# Patient Record
Sex: Male | Born: 1950 | Race: Black or African American | Hispanic: No | Marital: Single | Smoking: Never smoker
Health system: Southern US, Community
[De-identification: ages and names within clinical notes are randomized; demographics above are authoritative.]

## PROBLEM LIST (undated history)

## (undated) DIAGNOSIS — C61 Malignant neoplasm of prostate: Secondary | ICD-10-CM

## (undated) DIAGNOSIS — C801 Malignant (primary) neoplasm, unspecified: Secondary | ICD-10-CM

## (undated) HISTORY — PX: COLONOSCOPY: SHX174

## (undated) HISTORY — PX: APPENDECTOMY: SHX54

## (undated) HISTORY — PX: PROSTATE BIOPSY: SHX241

## (undated) HISTORY — DX: Malignant (primary) neoplasm, unspecified: C80.1

---

## 2005-08-20 ENCOUNTER — Encounter: Admission: RE | Admit: 2005-08-20 | Discharge: 2005-08-20 | Payer: Self-pay | Admitting: Family Medicine

## 2011-08-24 ENCOUNTER — Ambulatory Visit (INDEPENDENT_AMBULATORY_CARE_PROVIDER_SITE_OTHER): Payer: No Typology Code available for payment source

## 2011-08-24 DIAGNOSIS — J019 Acute sinusitis, unspecified: Secondary | ICD-10-CM

## 2011-08-24 DIAGNOSIS — J209 Acute bronchitis, unspecified: Secondary | ICD-10-CM

## 2011-08-30 ENCOUNTER — Ambulatory Visit (INDEPENDENT_AMBULATORY_CARE_PROVIDER_SITE_OTHER): Payer: No Typology Code available for payment source

## 2011-08-30 DIAGNOSIS — R059 Cough, unspecified: Secondary | ICD-10-CM

## 2011-08-30 DIAGNOSIS — R062 Wheezing: Secondary | ICD-10-CM

## 2011-08-30 DIAGNOSIS — R05 Cough: Secondary | ICD-10-CM

## 2011-09-22 ENCOUNTER — Ambulatory Visit: Payer: No Typology Code available for payment source

## 2011-09-22 ENCOUNTER — Ambulatory Visit (INDEPENDENT_AMBULATORY_CARE_PROVIDER_SITE_OTHER): Payer: No Typology Code available for payment source | Admitting: Family Medicine

## 2011-09-22 VITALS — BP 120/82 | HR 77 | Temp 98.0°F | Resp 16 | Ht 70.5 in | Wt 209.2 lb

## 2011-09-22 DIAGNOSIS — R059 Cough, unspecified: Secondary | ICD-10-CM

## 2011-09-22 DIAGNOSIS — R05 Cough: Secondary | ICD-10-CM

## 2011-09-22 LAB — POCT CBC
Granulocyte percent: 62.8 %G (ref 37–80)
HCT, POC: 43.9 % (ref 43.5–53.7)
Hemoglobin: 14.7 g/dL (ref 14.1–18.1)
Lymph, poc: 1.8 (ref 0.6–3.4)
MCH, POC: 32.3 pg — AB (ref 27–31.2)
MCHC: 33.5 g/dL (ref 31.8–35.4)
MCV: 96.4 fL (ref 80–97)
MID (cbc): 0.5 (ref 0–0.9)
MPV: 8.5 fL (ref 0–99.8)
POC Granulocyte: 3.8 (ref 2–6.9)
POC LYMPH PERCENT: 28.7 %L (ref 10–50)
POC MID %: 8.5 %M (ref 0–12)
Platelet Count, POC: 209 10*3/uL (ref 142–424)
RBC: 4.55 M/uL — AB (ref 4.69–6.13)
RDW, POC: 12.8 %
WBC: 6.1 10*3/uL (ref 4.6–10.2)

## 2011-09-22 MED ORDER — PREDNISONE 20 MG PO TABS: ORAL_TABLET | ORAL | Status: DC

## 2011-09-22 MED ORDER — MOMETASONE FURO-FORMOTEROL FUM 200-5 MCG/ACT IN AERO: 2.0000 | INHALATION_SPRAY | Freq: Two times a day (BID) | RESPIRATORY_TRACT | Status: DC

## 2011-09-22 NOTE — Patient Instructions (Addendum)
Cough, Adult  A cough is a reflex that helps clear your throat and airways. It can help heal the body or may be a reaction to an irritated airway. A cough may only last 2 or 3 weeks (acute) or may last more than 8 weeks (chronic).  CAUSES Acute cough:  Viral or bacterial infections.  Chronic cough:  Infections.   Allergies.   Asthma.   Post-nasal drip.   Smoking.   Heartburn or acid reflux.   Some medicines.   Chronic lung problems (COPD).   Cancer.  SYMPTOMS   Cough.   Fever.   Chest pain.   Increased breathing rate.   High-pitched whistling sound when breathing (wheezing).   Colored mucus that you cough up (sputum).  TREATMENT   A bacterial cough may be treated with antibiotic medicine.   A viral cough must run its course and will not respond to antibiotics.   Your caregiver may recommend other treatments if you have a chronic cough.  HOME CARE INSTRUCTIONS   Only take over-the-counter or prescription medicines for pain, discomfort, or fever as directed by your caregiver. Use cough suppressants only as directed by your caregiver.   Use a cold steam vaporizer or humidifier in your bedroom or home to help loosen secretions.   Sleep in a semi-upright position if your cough is worse at night.   Rest as needed.   Stop smoking if you smoke.  SEEK IMMEDIATE MEDICAL CARE IF:   You have pus in your sputum.   Your cough starts to worsen.   You cannot control your cough with suppressants and are losing sleep.   You begin coughing up blood.   You have difficulty breathing.   You develop pain which is getting worse or is uncontrolled with medicine.   You have a fever.  MAKE SURE YOU:   Understand these instructions.   Will watch your condition.   Will get help right away if you are not doing well or get worse.  Document Released: 01/29/2011 Document Revised: 04/14/2011 Document Reviewed: 01/29/2011 [facility] Patient Information 2012 [place],  [place].  Call Saturday with progress report

## 2011-09-22 NOTE — Progress Notes (Signed)
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  Able to sleep.  Nonsmoker, no asthma hx.  Occasionally tastes phlegm, nonproductive though.  No chills, fever, weight loss, or loss of appetite.  No TB exposure  O:  NAD.  Alert and cooperative HEENT:  Normal Neck:  No adenopathy or thyromegaly. Chest:  ronchi right posterior. Results for orders placed in visit on 09/22/11  POCT CBC      Component Value Range   WBC 6.1  4.6 - 10.2 (K/uL)   Lymph, poc 1.8  0.6 - 3.4    POC LYMPH PERCENT 28.7  10 - 50 (%L)   MID (cbc) 0.5  0 - 0.9    POC MID % 8.5  0 - 12 (%M)   POC Granulocyte 3.8  2 - 6.9    Granulocyte percent 62.8  37 - 80 (%G)   RBC 4.55 (*) 4.69 - 6.13 (M/uL)   Hemoglobin 14.7  14.1 - 18.1 (g/dL)   HCT, POC 40.9  81.1 - 53.7 (%)   MCV 96.4  80 - 97 (fL)   MCH, POC 32.3 (*) 27 - 31.2 (pg)   MCHC 33.5  31.8 - 35.4 (g/dL)   RDW, POC 91.4     Platelet Count, POC 209  142 - 424 (K/uL)   MPV 8.5  0 - 99.8 (fL)  [facility] reading (PRIMARY) by  Dr. Milus Glazier:  Negative   Assess:  Active airways disease.

## 2013-01-09 IMAGING — CR DG CHEST 2V
2 series · 2 of 2 positions shown · non-contrast
Comparison: 03/18/2011.

CLINICAL DATA: Persistent coughing.

CHEST - 2 VIEW

[PA]
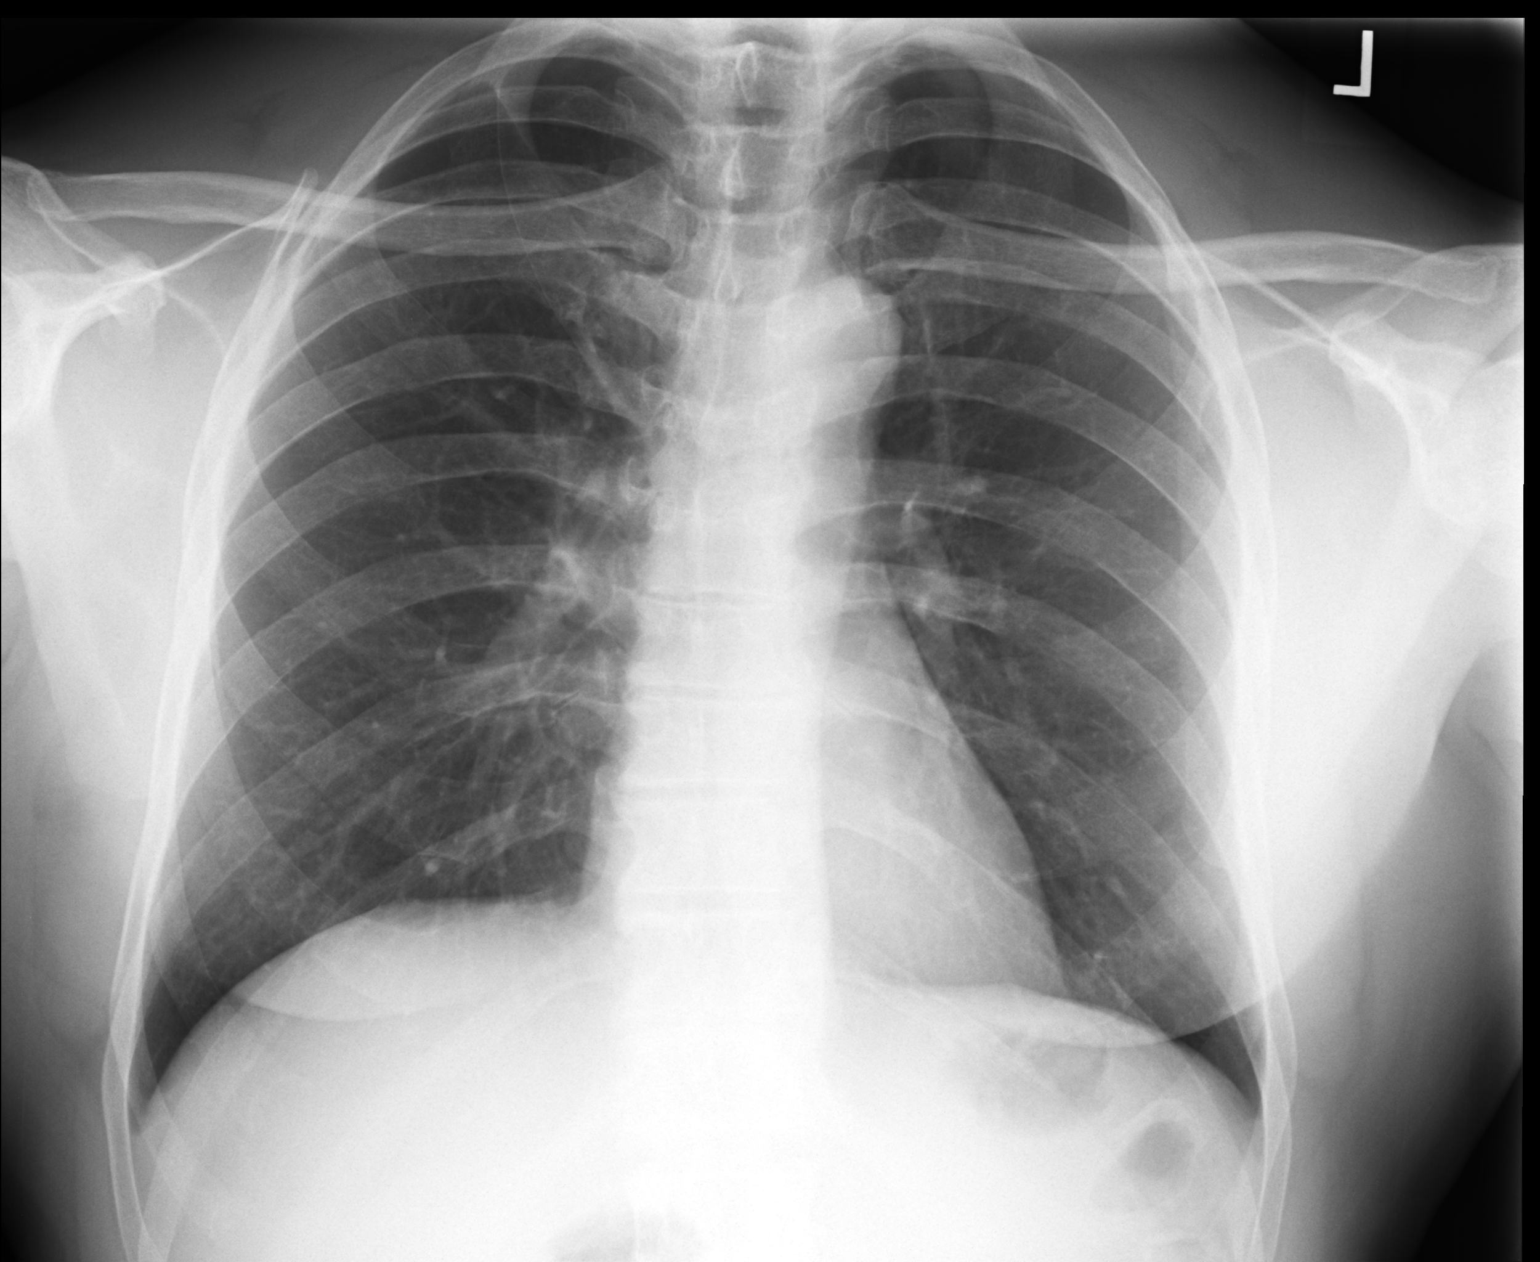

[lateral]
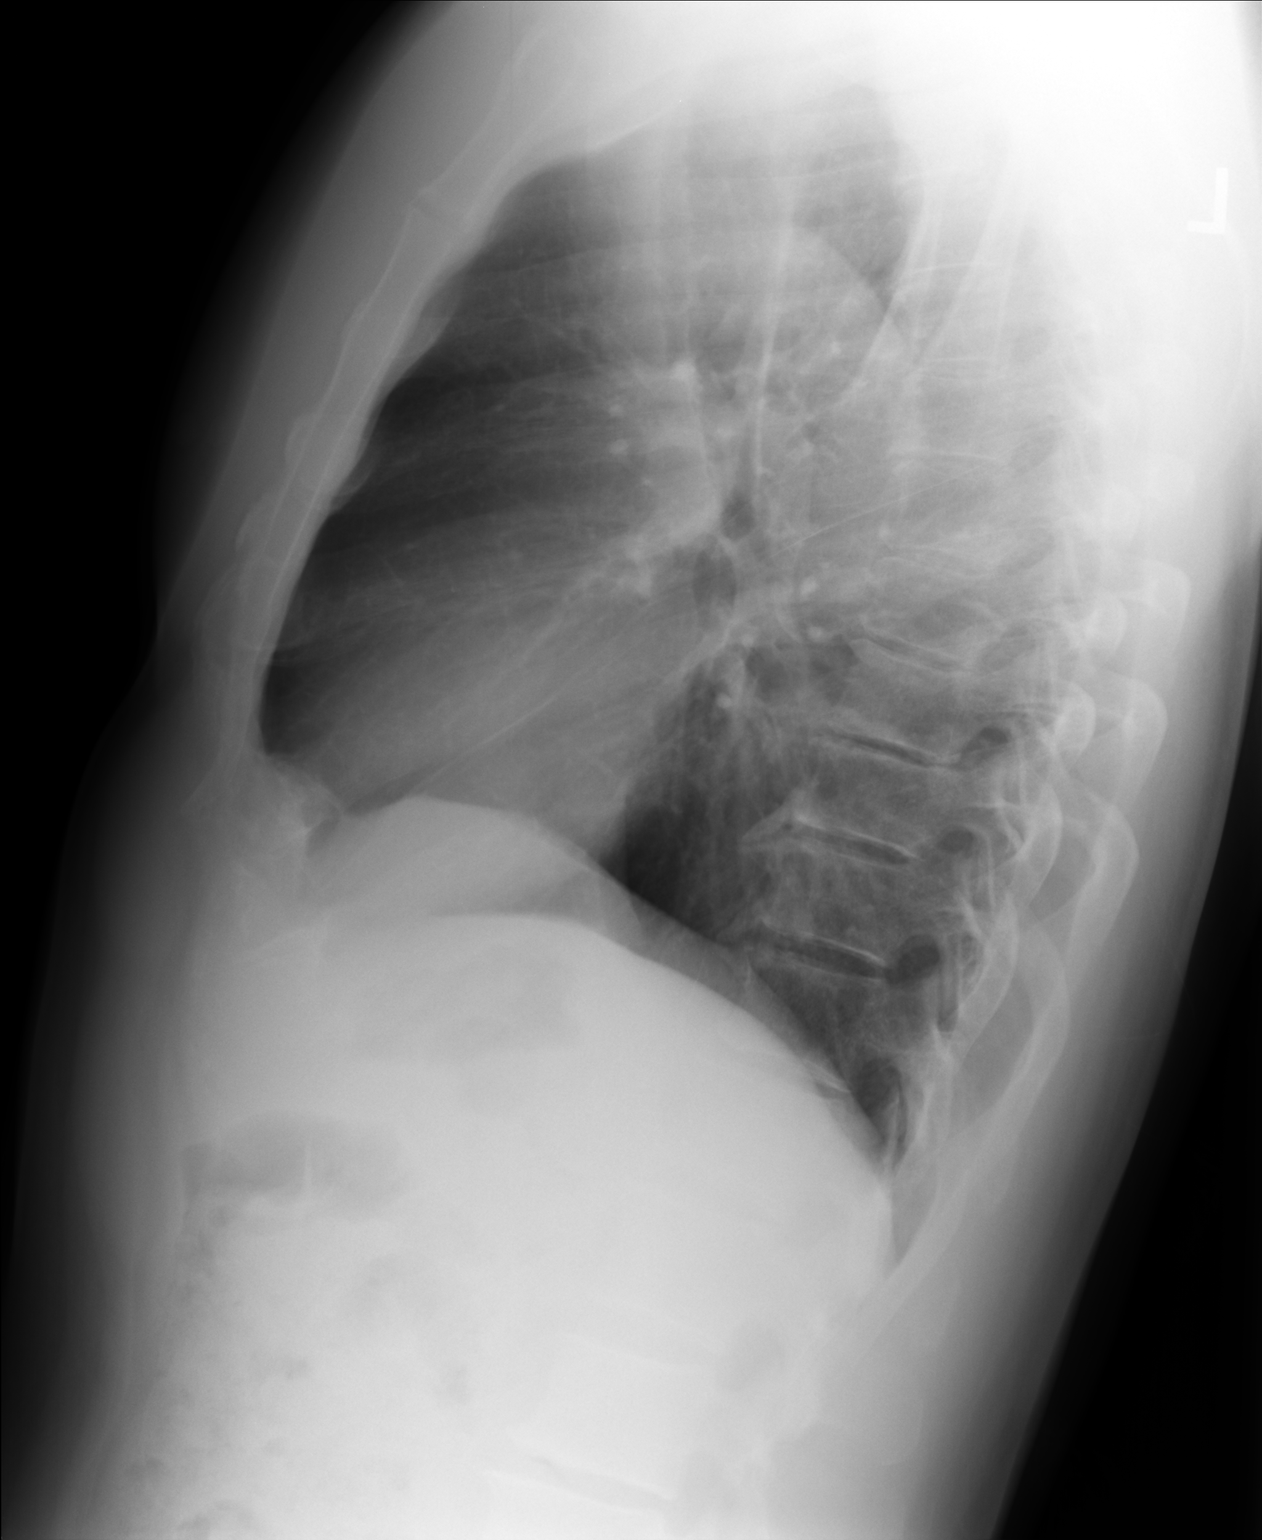

[2 of 2 positions shown; findings below may reference images not displayed]

FINDINGS: The cardiac silhouette is normal size and
shape.Mediastinal and hilar contours appear stable.  Azygos lobe
and fissure are present, a normal developmental variant.  No
pulmonary infiltrates or nodules were evident.  On lateral image
there appears to be some central peribronchial thickening.  There
is some osteophyte formation in the spine.
IMPRESSION: No pulmonary edema, pneumonia, or pleural effusion.  On lateral
image there appears to be some central peribronchial thickening.
This may be associate with bronchitis, asthma, and reactive airway
disease.

## 2013-01-17 ENCOUNTER — Ambulatory Visit (INDEPENDENT_AMBULATORY_CARE_PROVIDER_SITE_OTHER): Payer: No Typology Code available for payment source | Admitting: Emergency Medicine

## 2013-01-17 ENCOUNTER — Ambulatory Visit: Payer: No Typology Code available for payment source

## 2013-01-17 VITALS — BP 126/82 | HR 68 | Temp 97.8°F | Resp 16 | Ht 71.5 in | Wt 205.6 lb

## 2013-01-17 DIAGNOSIS — M25561 Pain in right knee: Secondary | ICD-10-CM

## 2013-01-17 DIAGNOSIS — M25569 Pain in unspecified knee: Secondary | ICD-10-CM

## 2013-01-17 NOTE — Progress Notes (Signed)
  Subjective:    Patient ID: Patrick Rich, male    DOB: 05-16-1951, 62 y.o.   MRN: [id number]  HPI patient states that last week he was doing deep knee squats and developed pain in the medial portion of his knee. He now has pain when he fully flexes his knee. He has had no locking and no giving way of the knee. He has a history of some pain in the left knee but none in the right knee. He is had no surgical procedures involving the right knee.   Review of Systems     Objective:   Physical Exam there is tenderness over the right medial joint space. There is a positive McMurray. There is a negative drawer sign. There is no joint effusion noted. Patellar apprehension test is normal  [facility] reading (PRIMARY) by  Dr. Cleta Alberts normal knee films minimal narrowing of the medial joint space        Assessment & Plan:  Patient taken 2 days off work. He'll be on meloxicam for pain. knee exercises given.

## 2013-01-17 NOTE — Patient Instructions (Addendum)
Try the exercises printed out for you. Take 1 pill of Mobic a day for pain and inflammation. Please let us know if you continue to have pain in your RIGHT knee. We will refer you to an orthopedist if needed.

## 2013-07-04 ENCOUNTER — Ambulatory Visit: Payer: No Typology Code available for payment source

## 2013-07-04 ENCOUNTER — Ambulatory Visit (INDEPENDENT_AMBULATORY_CARE_PROVIDER_SITE_OTHER): Payer: No Typology Code available for payment source | Admitting: Family Medicine

## 2013-07-04 VITALS — BP 120/74 | HR 86 | Temp 98.0°F | Resp 16 | Ht 71.0 in | Wt 204.0 lb

## 2013-07-04 DIAGNOSIS — M7020 Olecranon bursitis, unspecified elbow: Secondary | ICD-10-CM

## 2013-07-04 DIAGNOSIS — M7021 Olecranon bursitis, right elbow: Secondary | ICD-10-CM

## 2013-07-04 DIAGNOSIS — R05 Cough: Secondary | ICD-10-CM

## 2013-07-04 DIAGNOSIS — M25529 Pain in unspecified elbow: Secondary | ICD-10-CM

## 2013-07-04 DIAGNOSIS — M25521 Pain in right elbow: Secondary | ICD-10-CM

## 2013-07-04 MED ORDER — PREDNISONE 20 MG PO TABS: ORAL_TABLET | ORAL | Status: DC
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
## 2013-07-04 NOTE — Patient Instructions (Signed)
Olecranon Bursitis  °with Rehab °A bursa is a fluid filled sac that is located between soft tissues (ligaments, tendons, skin) and bones. The purpose of a bursa is to allow the soft tissue to function smoothly, without friction. The olecrenon bursa is located between the back of the elbow (olecrenon) and the skin. Olecrenon bursitis involves inflammation of this bursa, resulting in pain. °SYMPTOMS  °· Pain, tenderness, swelling, warmth, or redness over the back of the elbow. °· Reduced range of motion of the affected elbow. °· Sometimes, severe pain with movement of the affected elbow. °· Crackling sound (crepitation) when the bursa is moved or touched. °· Often, painless swelling of the bursa. °· Fever (when infected). °CAUSES  °Olecranon bursitis is often caused by direct hit (trauma) to the elbow. Less commonly, it is due to overuse and/or strenuous exercise that the elbow is not used to. °RISK INCREASES WITH: °· Sports that require bending or landing on the elbow (football, volleyball). °· Vigorous or repetitive athletic training, or sudden increase or change in activity level (weekend warriors). °· Failure to warm up properly before activity. °· Poor exercise technique. °· Playing on artificial turf. °PREVENTION °· Avoid injuries and the overuse of muscles whenever possible. °· Warm up and stretch properly before activity. °· Allow for adequate recovery between workouts. °· Maintain physical fitness: °· Strength, flexibility, and endurance. °· Cardiovascular fitness. °· Learn and use proper technique. °· Wear properly fitted and padded protective equipment. °PROGNOSIS  °If treated properly, olecranon bursitis is usually curable within 2 weeks.  °RELATED COMPLICATIONS  °· Longer healing time, if not properly treated or if not given enough time to heal. °· Recurring symptoms that result in a chronic problem. °· Joint stiffness with permanent limitation of the affected joint's movement. °· Infection of the  bursa. °· Chronic inflammation or scarring of the bursa. °TREATMENT °Treatment first involves the use of ice and medicine, to reduce pain and inflammation. The use of strengthening and stretching exercises may help reduce pain with activity. These exercises may be performed at home or with a therapist. Elbow pads may be advised, to protect the bursa. If symptoms persist, despite non-surgical treatment, a procedure to withdraw fluid from the bursa may be advised. This procedure may be accompanied with an injection of corticosteroids, to reduce inflammation. Sometimes, surgery is needed to remove the bursa. °MEDICATION °· If pain medicine is needed, nonsteroidal anti-inflammatory medicines (aspirin and ibuprofen), or other minor pain relievers (acetaminophen), are often advised. °· Do not take pain medicine for 7 days before surgery. °· Prescription pain relievers may be given, if your caregiver thinks they are needed. Use only as directed and only as much as you need. °· Corticosteroid injections may be given by your caregiver. These injections should be reserved for the most serious cases, because they may only be given a certain number of times. °HEAT AND COLD °· Cold treatment (icing) should be applied for 10 to 15 minutes every 2 to 3 hours for inflammation and pain, and immediately after activity that aggravates your symptoms. Use ice packs or an ice massage. °· Heat treatment may be used before performing stretching and strengthening activities prescribed by your caregiver, physical therapist, or athletic trainer. Use a heat pack or a warm water soak. °SEEK IMMEDIATE MEDICAL CARE IF:  °· Symptoms get worse or do not improve in 2 weeks, despite treatment. °· Signs of infection develop, including fever of 102° F (38.9° C), increased pain, redness, warmth, or pus draining from   the bursa. °· New, unexplained symptoms develop. (Drugs used in treatment may produce side effects.) °EXERCISES  °RANGE OF MOTION (ROM) AND  STRETCHING EXERCISES - Olecranon Bursitis °These exercises may help you when beginning to rehabilitate your injury. Your symptoms may resolve with or without further involvement from your physician, physical therapist or athletic trainer. While completing these exercises, remember:  °· Restoring tissue flexibility helps normal motion to return to the joints. This allows healthier, less painful movement and activity. °· An effective stretch should be held for at least 30 seconds. °· A stretch should never be painful. You should only feel a gentle lengthening or release in the stretched tissue. °RANGE OF MOTION  Elbow Flexion, Supine °· Lie on your back. Extend your right / left arm into the air, bracing it with your opposite hand. Allow your right / left arm to relax. °· Let your elbow bend, allowing your hand to fall slowly toward your chest. °· You should feel a gentle stretch along the back of your upper arm and elbow. Your physician, physical therapist or athletic trainer may ask you to hold a __________ hand weight to increase the intensity of this stretch. °· Hold for __________ seconds. Slowly return your right / left arm to the upright position. °Repeat __________ times. Complete this exercise __________ times per day. °STRETCH  Elbow Flexors °· Lie on a firm bed or countertop on your back. Be sure that you are in a comfortable position which will allow you to relax your arm muscles. °· Place a folded towel under your right / left upper arm, so that your elbow and shoulder are at the same height. Extend your arm; your elbow should not rest on the bed or towel °· Allow the weight of your hand to straighten your elbow. Keep your arm and chest muscles relaxed. Your caregiver may ask you to increase the intensity of your stretch by adding a small wrist or hand weight. °· Hold for __________ seconds. You should feel a stretch on the inside of your elbow. Slowly return to the starting position. °Repeat __________  times. Complete this exercise __________ times per day. °STRENGTHENING EXERCISES - Olecranon Bursitis °These exercises will help you regain your strength. They may resolve your symptoms with or without further involvement from your physician, physical therapist or athletic trainer. While completing these exercises, remember:  °· Muscles can gain both the endurance and the strength needed for everyday activities through controlled exercises. °· Complete these exercises as instructed by your physician, physical therapist or athletic trainer. Increase the resistance and repetitions only as guided by your caregiver. °· You may experience muscle soreness or fatigue, but the pain or discomfort you are trying to eliminate should never worsen during these exercises. If this pain does worsen, stop and make certain you are following the directions exactly. If the pain is still present after adjustments, discontinue the exercise until you can discuss the trouble with your caregiver. °STRENGTH - Elbow Extensors, Isometric °· Stand or sit upright on a firm surface. Place your right / left arm so that your palm faces your stomach, and it is at the height of your waist. °· Place your opposite hand on the underside of your forearm. Gently push up as your right / left arm resists. Push as hard as you can with both arms without causing any pain or movement at your right / left elbow. Hold this stationary position for __________ seconds. °· Gradually release the tension in both arms. Allow   your muscles to relax completely before repeating. °Repeat __________ times. Complete this exercise __________ times per day. °STRENGTH - Elbow Flexors, Isometric °· Stand or sit upright on a firm surface. Place your right / left arm so that your hand is palm-up and at the height of your waist. °· Place your opposite hand on top of your forearm. Gently push down as your right / left arm resists. Push as hard as you can with both arms without causing  any pain or movement at your right / left elbow. Hold this stationary position for __________ seconds. °· Gradually release the tension in both arms. Allow your muscles to relax completely before repeating. °Repeat __________ times. Complete this exercise __________ times per day. °STRENGTH  Elbow Flexors, Supinated °· With good posture, stand or sit on a firm chair without armrests. Allow your right / left arm to rest at your side with your palm facing forward. °· Holding a __________ weight, or gripping a rubber exercise band or tubing, °· bring your hand toward your shoulder. °· Allow your muscles to control the resistance as your hand returns to your side. °Repeat __________ times. Complete this exercise __________ times per day.  °STRENGTH  Elbow Flexors, Neutral °· With good posture, stand or sit on a firm chair without armrests. Allow your right / left arm to rest at your side with your thumb facing forward. °· Holding a __________weight, or gripping a rubber exercise band or tubing, °· bring your hand toward your shoulder. °· Allow your muscles to control the resistance as your hand returns to your side. °Repeat __________ times. Complete this exercise __________ times per day.  °STRENGTH  Elbow Extensors °· Lie on your back. Extend your right / left elbow into the air, pointing it toward the ceiling. Brace your arm with your opposite hand.* °· Holding a __________ weight in your hand, slowly straighten your right / left elbow. °· Allow your muscles to control the weight as your hand returns to its starting position. °Repeat __________ times. Complete this exercise __________ times per day. °*You may also stand with your elbow overhead and pointed toward the ceiling, supported by your opposite hand. °STRENGTH - Elbow Extensors, Dynamic °· With good posture, stand, or sit on a firm chair without armrests. Keeping your upper arms at your side, bring both hands up to your right / left shoulder while gripping a  rubber exercise band or tubing. Your right / left hand should be just below the other hand. °· Straighten your right / left elbow. Hold for __________ seconds. °· Allow your muscles to control the rubber exercise band, as your hand returns to your shoulder. °Repeat __________ times. Complete this exercise __________ times per day. °Document Released: 08/02/2005 Document Revised: 10/25/2011 Document Reviewed: 11/14/2008 °[employer]® Patient Information ©2014 [employer]. ° °

## 2014-10-22 IMAGING — CR DG ELBOW COMPLETE 3+V*R*
2 series · 2 of 2 positions shown · non-contrast
Comparison: None.

CLINICAL DATA: Right elbow pain, olecranon bursitis.

EXAM:
RIGHT ELBOW - COMPLETE 3+ VIEW

[AP]
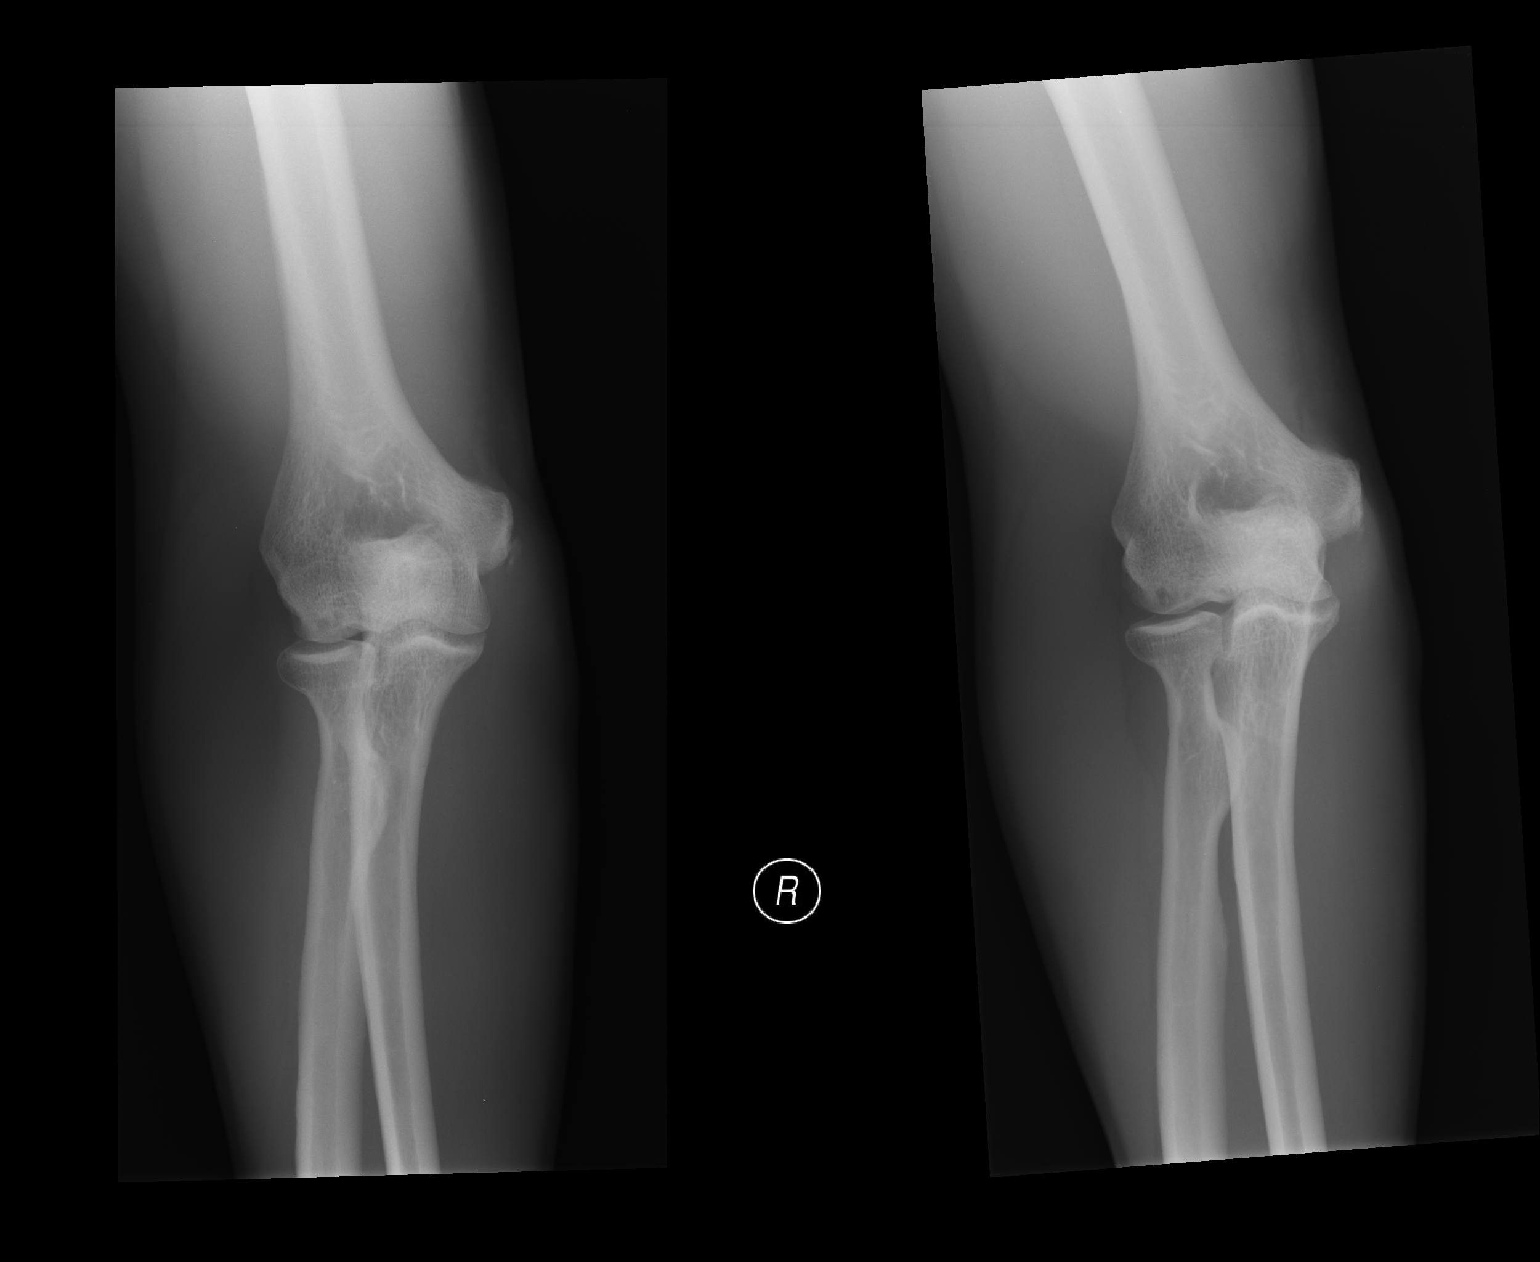

[ap obl ext rot]
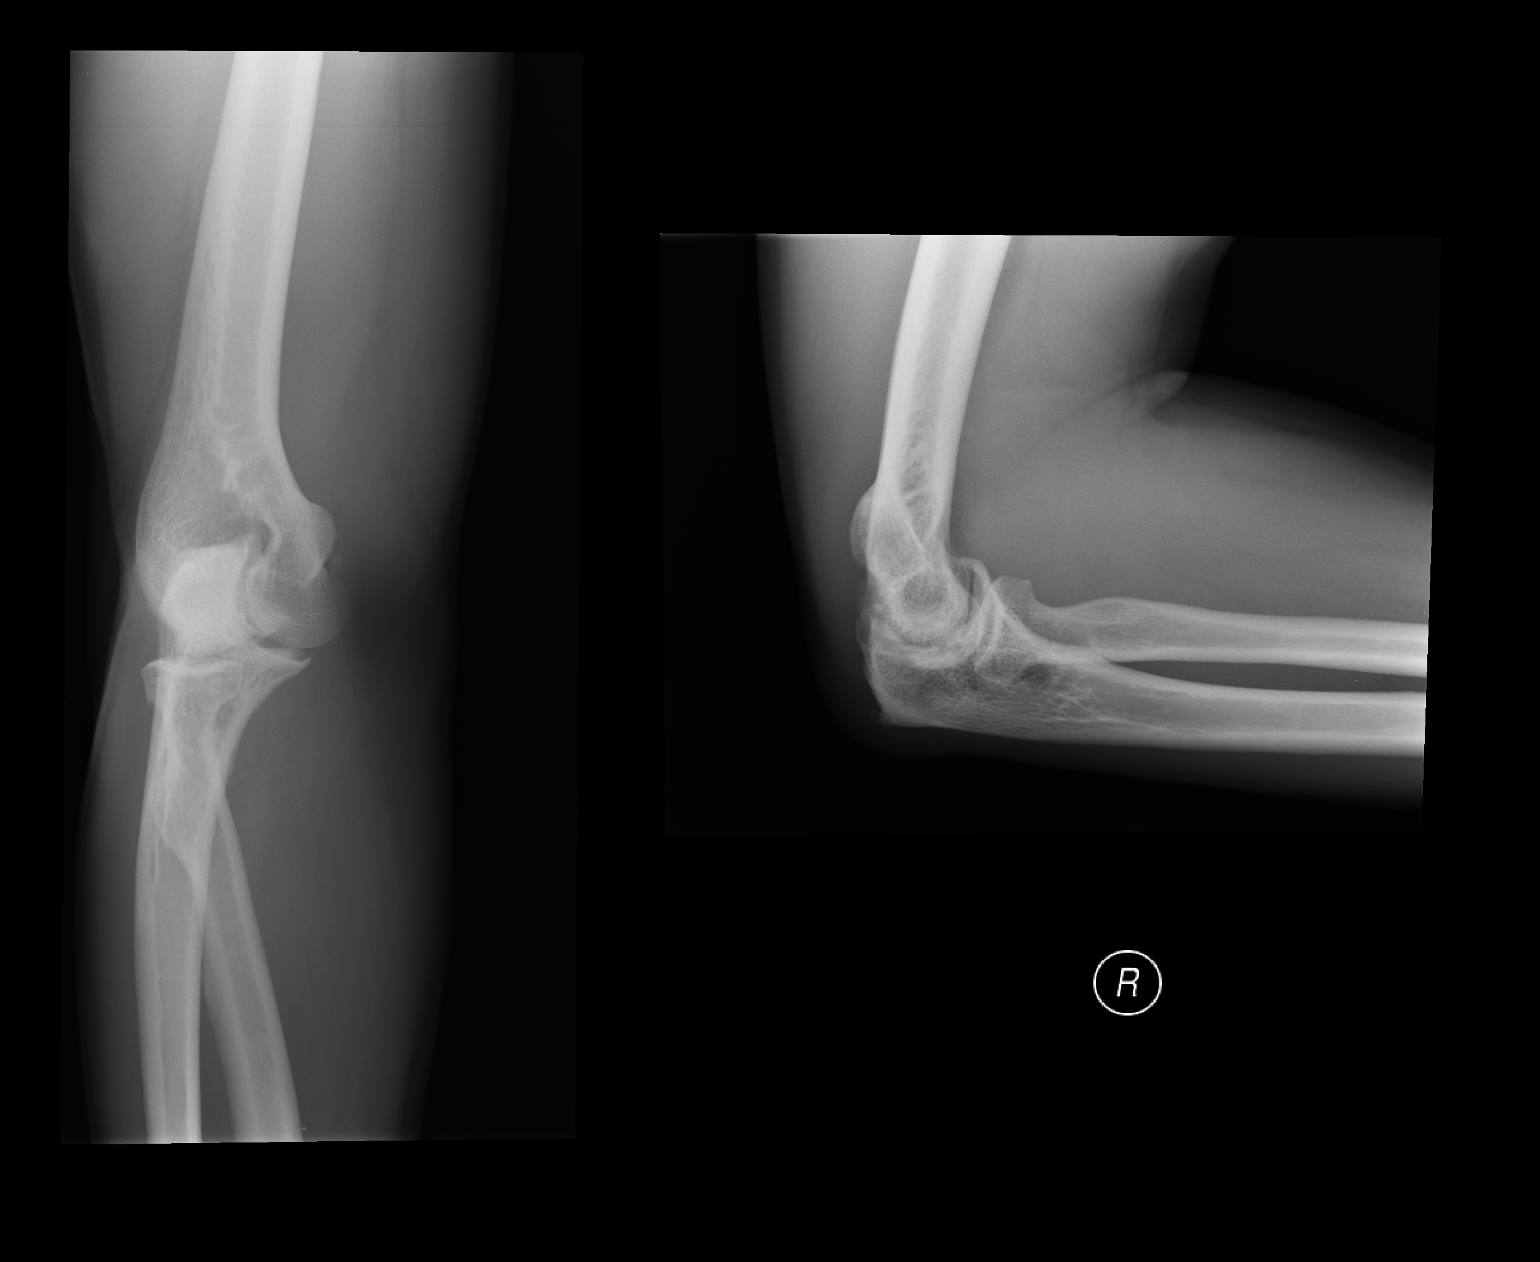

[2 of 2 positions shown; findings below may reference images not displayed]

FINDINGS: There is no evidence of fracture, dislocation, or joint effusion.
There is no evidence of arthropathy or other focal bone abnormality.
Soft tissues are unremarkable.
IMPRESSION: Normal right elbow.

## 2017-04-11 ENCOUNTER — Encounter: Payer: Self-pay | Admitting: Physician Assistant

## 2017-04-11 ENCOUNTER — Ambulatory Visit (INDEPENDENT_AMBULATORY_CARE_PROVIDER_SITE_OTHER): Payer: Medicare Other | Admitting: Physician Assistant

## 2017-04-11 VITALS — BP 134/79 | HR 71 | Temp 98.0°F | Resp 18 | Ht 70.75 in | Wt 192.2 lb

## 2017-04-11 DIAGNOSIS — J069 Acute upper respiratory infection, unspecified: Secondary | ICD-10-CM | POA: Diagnosis not present

## 2017-04-11 MED ORDER — PROMETHAZINE-CODEINE 6.25-10 MG/5ML PO SYRP: 5.0000 mL | ORAL_SOLUTION | Freq: Three times a day (TID) | ORAL | 0 refills | Status: DC | PRN

## 2017-04-11 MED ORDER — IPRATROPIUM BROMIDE 0.03 % NA SOLN: 2.0000 | Freq: Two times a day (BID) | NASAL | 0 refills | Status: DC

## 2017-04-11 MED ORDER — BENZONATATE 100 MG PO CAPS: 100.0000 mg | ORAL_CAPSULE | Freq: Three times a day (TID) | ORAL | 0 refills | Status: DC | PRN

## 2017-04-11 MED ORDER — HYDROCODONE-HOMATROPINE 5-1.5 MG/5ML PO SYRP: 5.0000 mL | ORAL_SOLUTION | Freq: Three times a day (TID) | ORAL | 0 refills | Status: DC | PRN

## 2017-04-11 MED ORDER — MUCINEX DM MAXIMUM STRENGTH 60-1200 MG PO TB12: 1.0000 | ORAL_TABLET | Freq: Two times a day (BID) | ORAL | 0 refills | Status: DC

## 2017-04-11 MED ORDER — DIPHENHYDRAMINE-PE-APAP 12.5-5-325 MG/10ML PO LIQD: 10.0000 mL | Freq: Two times a day (BID) | ORAL | 0 refills | Status: DC

## 2017-04-11 NOTE — Progress Notes (Signed)
MRN: [id number] DOB: 1951/06/23  Subjective:   Patrick Rich is a 66 y.o. male presenting for chief complaint of Cough (X 1 week) and Sore Throat .  Reports 1 week history of rhinorrhea, sore throat and mostly dry cough (no hemoptysis). His sore throat and rhinorrhea had resolved with hot tea with honey but his cough has lingered. The throat soreness returned this morning. Has tried OTC decongestant and throat lozenges with mild relief. Denies fever, sinus congestion, sinus pain, ear pain, wheezing, shortness of breath, chest tightness and chest pain, chills, malaise, decreased appetite, nausea, vomiting, abdominal pain and diarrhea. Has not had sick contact with anyone. Has history of seasonal allergies, no history of asthma. Denies smoking. No recent travel. Denies any other aggravating or relieving factors, no other questions or concerns.  Patrick Rich has a current medication list which includes the following prescription(s): benzonatate, meloxicam, mometasone-formoterol, prednisone, and promethazine-codeine. Also has No Known Allergies.  Patrick Rich  has a past medical history of Cancer [facility]) (July 20018) and Heartburn. Also  has a past surgical history that includes Appendectomy.   Objective:   Vitals: BP 134/79 (BP Location: Left Arm, Patient Position: Sitting, Cuff Size: Large)   Pulse 71   Temp 98 F (36.7 C) (Oral)   Resp 18   Ht 5' 10.75" (1.797 m)   Wt 192 lb 3.2 oz (87.2 kg)   SpO2 99%   BMI 27.00 kg/m   Physical Exam  Constitutional: He is oriented to person, place, and time. He appears well-developed and well-nourished. No distress.  HENT:  Head: Normocephalic and atraumatic.  Right Ear: Tympanic membrane, external ear and ear canal normal.  Left Ear: Tympanic membrane, external ear and ear canal normal.  Nose: Mucosal edema (mild bilaterally) and rhinorrhea present. Right sinus exhibits no maxillary sinus tenderness and no frontal sinus tenderness. Left sinus exhibits no  maxillary sinus tenderness and no frontal sinus tenderness.  Mouth/Throat: Uvula is midline. Posterior oropharyngeal erythema (cobblestoning noted) present. No tonsillar exudate.  Eyes: Conjunctivae are normal.  Neck: Normal range of motion.  Cardiovascular: Normal rate, regular rhythm and normal heart sounds.   Pulmonary/Chest: Effort normal and breath sounds normal. He has no wheezes. He has no rhonchi. He has no rales.  Lymphadenopathy:       Head (right side): No submental, no submandibular, no tonsillar, no preauricular, no posterior auricular and no occipital adenopathy present.       Head (left side): No submental, no submandibular, no tonsillar, no preauricular, no posterior auricular and no occipital adenopathy present.    He has no cervical adenopathy.       Right: No supraclavicular adenopathy present.       Left: No supraclavicular adenopathy present.  Neurological: He is alert and oriented to person, place, and time.  Skin: Skin is warm and dry.  Psychiatric: He has a normal mood and affect.  Vitals reviewed.   No results found for this or any previous visit (from the past 24 hour(s)).  Assessment and Plan :  1. Acute upper respiratory infection PE findings reassuring. Vitals stable. Likely viral etiology. Will treat symptomatically at this time. Pt instructed to return to or contact clinic if symptoms worsen, do not improve in 3-5 days, or as needed - benzonatate (TESSALON) 100 MG capsule; Take 1-2 capsules (100-200 mg total) by mouth 3 (three) times daily as needed for cough.  Dispense: 40 capsule; Refill: 0 - ipratropium (ATROVENT) 0.03 % nasal spray; Place 2 sprays into both  nostrils 2 (two) times daily.  Dispense: 30 mL; Refill: 0 - Dextromethorphan-Guaifenesin (MUCINEX DM MAXIMUM STRENGTH) 60-1200 MG TB12; Take 1 tablet by mouth every 12 (twelve) hours.  Dispense: 20 each; Refill: 0 - Diphenhydramine-PE-APAP (DELSYM COUGH/COLD NIGHT TIME) 12.5-5-325 MG/10ML LIQD; Take 10  mLs by mouth 2 (two) times daily.  Dispense: 118 mL; Refill: 0  Tenna Delaine, PA-C  [facility] [place] 04/11/2017 10:03 AM

## 2017-04-11 NOTE — Patient Instructions (Addendum)
- We will treat this as a respiratory viral infection.  - I recommend you rest, drink plenty of fluids, eat light meals including soups.  - You may use cough syrup at night for your cough and sore throat, Tessalon pearls and mucinex during the day. - You may also use Tylenol or ibuprofen over-the-counter for your sore throat.  - I also recommend atrovent nasal spray for runny, stuffy nose. - Please let me know if you are not seeing any improvement or get worse in 3-5 days.   Upper Respiratory Infection, Adult Most upper respiratory infections (URIs) are caused by a virus. A URI affects the nose, throat, and upper air passages. The most common type of URI is often called "the common cold." Follow these instructions at home:  Take medicines only as told by your doctor.  Gargle warm saltwater or take cough drops to comfort your throat as told by your doctor.  Use a warm mist humidifier or inhale steam from a shower to increase air moisture. This may make it easier to breathe.  Drink enough fluid to keep your pee (urine) clear or pale yellow.  Eat soups and other clear broths.  Have a healthy diet.  Rest as needed.  Go back to work when your fever is gone or your doctor says it is okay. ? You may need to stay home longer to avoid giving your URI to others. ? You can also wear a face mask and wash your hands often to prevent spread of the virus.  Use your inhaler more if you have asthma.  Do not use any tobacco products, including cigarettes, chewing tobacco, or electronic cigarettes. If you need help quitting, ask your doctor. Contact a doctor if:  You are getting worse, not better.  Your symptoms are not helped by medicine.  You have chills.  You are getting more short of breath.  You have brown or red mucus.  You have yellow or brown discharge from your nose.  You have pain in your face, especially when you bend forward.  You have a fever.  You have puffy (swollen)  neck glands.  You have pain while swallowing.  You have white areas in the back of your throat. Get help right away if:  You have very bad or constant: ? Headache. ? Ear pain. ? Pain in your forehead, behind your eyes, and over your cheekbones (sinus pain). ? Chest pain.  You have long-lasting (chronic) lung disease and any of the following: ? Wheezing. ? Long-lasting cough. ? Coughing up blood. ? A change in your usual mucus.  You have a stiff neck.  You have changes in your: ? Vision. ? Hearing. ? Thinking. ? Mood. This information is not intended to replace advice given to you by your health care provider. Make sure you discuss any questions you have with your health care provider. Document Released: 01/19/2008 Document Revised: 04/04/2016 Document Reviewed: 11/07/2013 Elsevier Interactive Patient Education  2018 [employer].     IF you received an x-ray today, you will receive an invoice from [facility]. Please contact [facility] at [phone] with questions or concerns regarding your invoice.   IF you received labwork today, you will receive an invoice from [facility]. Please contact [facility] at [phone] with questions or concerns regarding your invoice.   Our billing staff will not be able to assist you with questions regarding bills from these companies.  You will be contacted with the lab results as soon as they  are available. The fastest way to get your results is to activate your My Chart account. Instructions are located on the last page of this paperwork. If you have not heard from [place] regarding the results in 2 weeks, please contact this office.

## 2017-05-08 ENCOUNTER — Other Ambulatory Visit: Payer: Self-pay | Admitting: Physician Assistant

## 2017-05-08 DIAGNOSIS — J069 Acute upper respiratory infection, unspecified: Secondary | ICD-10-CM

## 2018-04-19 ENCOUNTER — Ambulatory Visit (INDEPENDENT_AMBULATORY_CARE_PROVIDER_SITE_OTHER): Payer: Medicare Other | Admitting: Family Medicine

## 2018-04-19 ENCOUNTER — Other Ambulatory Visit: Payer: Self-pay

## 2018-04-19 ENCOUNTER — Encounter: Payer: Self-pay | Admitting: Family Medicine

## 2018-04-19 VITALS — BP 133/86 | HR 75 | Temp 98.0°F | Ht 71.0 in | Wt 196.2 lb

## 2018-04-19 DIAGNOSIS — T63461A Toxic effect of venom of wasps, accidental (unintentional), initial encounter: Secondary | ICD-10-CM | POA: Diagnosis not present

## 2018-04-19 DIAGNOSIS — M79642 Pain in left hand: Secondary | ICD-10-CM | POA: Diagnosis not present

## 2018-04-19 MED ORDER — PREDNISONE 20 MG PO TABS: ORAL_TABLET | ORAL | 0 refills | Status: AC

## 2018-04-19 MED ORDER — DIPHENHYDRAMINE HCL 25 MG PO CAPS: 25.0000 mg | ORAL_CAPSULE | Freq: Four times a day (QID) | ORAL | 0 refills | Status: DC | PRN

## 2018-04-19 MED ORDER — TRIAMCINOLONE ACETONIDE 0.1 % EX CREA: 1.0000 "application " | TOPICAL_CREAM | Freq: Two times a day (BID) | CUTANEOUS | 0 refills | Status: DC

## 2018-04-19 NOTE — Patient Instructions (Addendum)
If you have lab work done today you will be contacted with your lab results within the next 2 weeks.  If you have not heard from [place] then please contact us. The fastest way to get your results is to register for My Chart.   IF you received an x-ray today, you will receive an invoice from [facility]. Please contact [facility] at [phone] with questions or concerns regarding your invoice.   IF you received labwork today, you will receive an invoice from [facility]. Please contact [facility] at [phone] with questions or concerns regarding your invoice.   Our billing staff will not be able to assist you with questions regarding bills from these companies.  You will be contacted with the lab results as soon as they are available. The fastest way to get your results is to activate your My Chart account. Instructions are located on the last page of this paperwork. If you have not heard from [place] regarding the results in 2 weeks, please contact this office.    b Bee, Wasp, or [place], Adult Bees, wasps, and hornets are part of a family of insects that can sting people. These stings can cause pain and inflammation, but they are usually not serious. However, some people may have an allergic reaction to a sting. This can cause the symptoms to be more severe. What increases the risk? You may be at a greater risk of getting stung if you:  Provoke a stinging insect by swatting or disturbing it.  Wear strong-smelling soaps, deodorants, or body sprays.  Spend time outdoors near gardens with flowers or fruit trees or in clothes that expose skin.  Eat or drink outside.  What are the signs or symptoms? Common symptoms of this condition include:  A red lump in the skin that sometimes has a tiny hole in the center. In some cases, a stinger may be in the center of the wound.  Pain and itching at the sting site.  Redness and swelling around the sting site. If you have an  allergic reaction (localized allergic reaction), the swelling and redness may spread out from the sting site. In some cases, this reaction can continue to develop over the next 24-48 hours.  In rare cases, a person may have a severe allergic reaction (anaphylactic reaction) to a sting. Symptoms of an anaphylactic reaction may include:  Wheezing or difficulty breathing.  Raised, itchy, red patches on the skin (hives).  Nausea or vomiting.  Abdominal cramping.  Diarrhea.  Tightness in the chest or chest pain.  Dizziness or fainting.  Redness of the face (flushing).  Hoarse voice.  Swollen tongue, lips, or face.  How is this diagnosed? This condition is usually diagnosed based on your symptoms and medical history as well as a physical exam. You may have an allergy test to determine if you are allergic to the substance that the insect injected during the sting (venom). How is this treated? If you were stung by a bee, the stinger and a small sac of venom may be in the wound. It is important to remove the stinger as soon as possible. You can do this by brushing across the wound with gauze, a fingernail, or a flat card such as a credit card. Removing the stinger can help reduce the severity of your body's reaction to the sting. Most stings can be treated with:  Icing to reduce swelling in the area.  Medicines (antihistamines) to treat itching or an allergic  reaction.  Medicines to help reduce pain. These may be medicines that you take by mouth, or medicated creams or lotions that you apply to your skin.  Pay close attention to your symptoms after you have been stung. If possible, have someone stay with you to make sure you do not have an allergic reaction. If you have any signs of an allergic reaction, call your health care provider. If you have ever had a severe allergic reaction, your health care provider may give you an inhaler or injectable medicine (epinephrine auto-injector) to use  if necessary. Follow these instructions at home:  Wash the sting site 2-3 times each day with soap and water as told by your health care provider.  Apply or take over-the-counter and prescription medicines only as told by your health care provider.  If directed, apply ice to the sting area. ? Put ice in a plastic bag. ? Place a towel between your skin and the bag. ? Leave the ice on for 20 minutes, 2-3 times a day.  Do not scratch the sting area.  If you had a severe allergic reaction to a sting, you may need: ? To wear a medical bracelet or necklace that lists the allergy. ? To learn when and how to use an anaphylaxis kit or epinephrine injection. Your family members and coworkers may also need to learn this. ? To carry an anaphylaxis kit or epinephrine injection with you at all times. How is this prevented?  Avoid swatting at stinging insects and disturbing insect nests.  Do not use fragrant soaps or lotions.  Wear shoes, pants, and long sleeves when spending time outdoors, especially in grassy areas where stinging insects are common.  Keep outdoor areas free from nests or hives.  Keep food and drink containers covered when eating outdoors.  Avoid working or sitting near [employer], if possible.  Wear gloves if you are gardening or working outdoors.  If an attack by a stinging insect or a swarm seems likely in the moment, move away from the area or find a barrier between you and the insect(s), such as a door. Contact a health care provider if:  Your symptoms do not get better in 2-3 days.  You have redness, swelling, or pain that spreads beyond the area of the sting.  You have a fever. Get help right away if: You have symptoms of a severe allergic reaction. These include:  Wheezing or difficulty breathing.  Tightness in the chest or chest pain.  Light-headedness or fainting.  Itchy, raised, red patches on the skin.  Nausea or vomiting.  Abdominal  cramping.  Diarrhea.  A swollen tongue or lips, or trouble swallowing.  Dizziness or fainting.  Summary  Stings from bees, wasps, and hornets can cause pain and inflammation, but they are usually not serious. However, some people may have an allergic reaction to a sting. This can cause the symptoms to be more severe.  Pay close attention to your symptoms after you have been stung. If possible, have someone stay with you to make sure you do not have an allergic reaction.  Call your health care provider if you have any signs of an allergic reaction. This information is not intended to replace advice given to you by your health care provider. Make sure you discuss any questions you have with your health care provider. Document Released: 08/02/2005 Document Revised: 10/07/2016 Document Reviewed: 10/07/2016 Elsevier Interactive Patient Education  [employer].

## 2018-04-19 NOTE — Progress Notes (Signed)
   9/4/20192:27 PM  Enzo Bi May 29, 1951, 67 y.o. male [id number]  Chief Complaint  Patient presents with  . Edema    thinks he may have gotten stung by bee yesterday while doing yardwork    HPI:   Patient is a 67 y.o. male  who presents today for left hand swelling  Stung by a wasp yesterday Has been swelling since last night Took benadryl 50mg  yesterday  No SOB, n/v This has not happened before from previous stings  Fall Risk  04/19/2018 04/11/2017  Falls in the past year? No No     Depression screen [facility] 2/9 04/19/2018 04/11/2017  Decreased Interest 0 0  Down, Depressed, Hopeless 0 0  PHQ - 2 Score 0 0    No Known Allergies  Prior to Admission medications   Medication Sig Start Date End Date Taking? Authorizing Provider  Cod Liver Oil CAPS Take by mouth.   Yes [provider]  Multiple Vitamin (MULTI-VITAMINS) TABS Take by mouth.   Yes [provider]    Past Medical History:  Diagnosis Date  . Cancer [facility]) July 20018   prostate  . Heartburn    occasional    Past Surgical History:  Procedure Laterality Date  . APPENDECTOMY      Social History   Tobacco Use  . Smoking status: Never Smoker  . Smokeless tobacco: Never Used  Substance Use Topics  . Alcohol use: No    Family History  Problem Relation Age of Onset  . Hypertension Mother   . Diabetes Father   . Hypertension Father   . Prostate cancer Father   . Heart disease Sister     ROS Per hpi  OBJECTIVE:  Blood pressure 133/86, pulse 75, temperature 98 F (36.7 C), temperature source Oral, height 5\' 11"  (1.803 m), weight 196 lb 3.2 oz (89 kg), SpO2 100 %. Body mass index is 27.36 kg/m.   Physical Exam  Gen: aaox3, nad LUE: swollen, warm upto elbow. FROM. NVI   ASSESSMENT and PLAN  1. Pain of left hand 2. Wasp sting, accidental or unintentional, initial encounter Discussed supportive measures, new meds r/se/b and RTC precautions. Patient educational handout  given.  Other orders - diphenhydrAMINE (BENADRYL) 25 mg capsule; Take 1-2 capsules (25-50 mg total) by mouth every 6 (six) hours as needed. - predniSONE (DELTASONE) 20 MG tablet; Take 2 tablets (40 mg total) by mouth daily with breakfast for 1 day, THEN 1 tablet (20 mg total) daily with breakfast for 1 day. - triamcinolone cream (KENALOG) 0.1 %; Apply 1 application topically 2 (two) times daily.  Return if symptoms worsen or fail to improve.    Rutherford Guys, MD [facility] at [place], [place] [address] Ph.  [phone] Fax [phone]

## 2018-05-11 ENCOUNTER — Encounter: Payer: Self-pay | Admitting: Radiation Oncology

## 2018-05-22 ENCOUNTER — Ambulatory Visit
Admission: RE | Admit: 2018-05-22 | Discharge: 2018-05-22 | Disposition: A | Discharge status: Home / Self Care | Payer: Self-pay | Source: Ambulatory Visit | Attending: Radiation Oncology | Admitting: Radiation Oncology

## 2018-05-22 ENCOUNTER — Other Ambulatory Visit: Payer: Self-pay | Admitting: Radiation Oncology

## 2018-05-22 DIAGNOSIS — C61 Malignant neoplasm of prostate: Secondary | ICD-10-CM

## 2018-05-23 ENCOUNTER — Encounter: Payer: Self-pay | Admitting: Radiation Oncology

## 2018-05-23 NOTE — Progress Notes (Signed)
GU Location of Tumor / Histology: prostatic adenocarcinoma  If Prostate Cancer, Gleason Score is (3 + 4) and PSA is (5.690) on 09/26/2017. Prostate volume: 42 cc  Patrick Rich has been under active surveillance. Recent repeat prostate biopsy showed clinically significant upstaging of disease.  Biopsies of prostate (if applicable) revealed:     Past/Anticipated interventions by urology, if any: prostate biopsy, active surveillance, repeat prostate biopsy, referral to radiation oncology.  Past/Anticipated interventions by medical oncology, if any: no  Weight changes, if any: no  Bowel/Bladder complaints, if any: IPSS 1 with nocturia. SHIM 1. No sexual activity in six months. Denies dysuria, hematuria, leakage or incontinence.   Nausea/Vomiting, if any: no  Pain issues, if any:  no  SAFETY ISSUES:  Prior radiation? no  Pacemaker/ICD? no  Possible current pregnancy? no  Is the patient on methotrexate? no  Current Complaints / other details:  67 year old male. NKDA. Accompanied today by significant other.

## 2018-05-24 ENCOUNTER — Other Ambulatory Visit: Payer: Self-pay

## 2018-05-24 ENCOUNTER — Ambulatory Visit
Admission: RE | Admit: 2018-05-24 | Discharge: 2018-05-24 | Disposition: A | Discharge status: Home / Self Care | Payer: No Typology Code available for payment source | Source: Ambulatory Visit | Attending: Radiation Oncology | Admitting: Radiation Oncology

## 2018-05-24 VITALS — BP 143/89 | HR 73 | Temp 98.3°F | Resp 20 | Ht 71.0 in | Wt 199.4 lb

## 2018-05-24 DIAGNOSIS — C61 Malignant neoplasm of prostate: Secondary | ICD-10-CM | POA: Diagnosis present

## 2018-05-24 HISTORY — DX: Malignant neoplasm of prostate: C61

## 2018-05-24 NOTE — Progress Notes (Signed)
See progress note under physician encounter. 

## 2018-05-24 NOTE — Progress Notes (Signed)
Radiation Oncology         [phone]) [phone] ________________________________  Initial outpatient Consultation  Name: Patrick Rich MRN: [id number]  Date: 05/24/2018  DOB: 08/29/1950  [id number], No Pcp Per  Delorise Shiner, MD   REFERRING PHYSICIAN: Delorise Shiner, MD  DIAGNOSIS: 67 y.o. gentleman with Stage T1c adenocarcinoma of the prostate with Gleason Score of 3+4, and PSA of 5.69.    ICD-10-CM   1. Malignant neoplasm of prostate ([place]) C61     HISTORY OF PRESENT ILLNESS: Patrick Rich is a 67 y.o. male with a diagnosis of prostate cancer. He has been followed by the [facility] for prostate cancer. The patient underwent transrectal ultrasound with 12 biopsies of the prostate on 03/05/17 with his urologist at the [facility].  The PSA was 6.3 at the time of biopsy.  Out of 12 core biopsies, 4 were positive.  The maximum Gleason score was 3+3, and this was seen in the right side.  He opted for active surveillance.  Repeat of PSA on 09/26/17 was 5.69.  As part of his active surveillance, he underwent prostate MRI on 01/10/18, which showed a prostate volume of 42 cc and a concerning PRIADS 4 lesion in the left apex periferal zone measuring 1.3 x 1.4 cm without associated lymph adenopathy.  Additionally, there was an indeterminate 1 cm osseous lesion in the proximal left femur.  Patient proceeded with a repeat prostate biopsy on 03/14/18 which showed 16/33 cores positive with Gleason 3+4 disease in 13 cores and Gleason 3+3 disease in 3 cores.   The patient reviewed the biopsy results with his urologist and he has kindly been referred today for discussion of potential radiation treatment options.   PREVIOUS RADIATION THERAPY: No  PAST MEDICAL HISTORY:  Past Medical History:  Diagnosis Date  . Cancer [facility]) July 20018   prostate  . Heartburn    occasional  . Prostate cancer ([place])       PAST SURGICAL HISTORY: Past Surgical History:  Procedure Laterality Date  . APPENDECTOMY    . COLONOSCOPY  02/20/2013   . PROSTATE BIOPSY      FAMILY HISTORY:  Family History  Problem Relation Age of Onset  . Hypertension Mother   . Diabetes Father   . Hypertension Father   . Prostate cancer Father   . Heart disease Sister     SOCIAL HISTORY:  Social History   Socioeconomic History  . Marital status: Single    Spouse name: Not on file  . Number of children: Not on file  . Years of education: Not on file  . Highest education level: Not on file  Occupational History  . Not on file  Social Needs  . Financial resource strain: Not on file  . Food insecurity:    Worry: Not on file    Inability: Not on file  . Transportation needs:    Medical: Not on file    Non-medical: Not on file  Tobacco Use  . Smoking status: Never Smoker  . Smokeless tobacco: Never Used  Substance and Sexual Activity  . Alcohol use: No  . Drug use: No  . Sexual activity: Not on file  Lifestyle  . Physical activity:    Days per week: Not on file    Minutes per session: Not on file  . Stress: Not on file  Relationships  . Social connections:    Talks on phone: Not on file    Gets together: Not on file    Attends  religious service: Not on file    Active member of club or organization: Not on file    Attends meetings of clubs or organizations: Not on file    Relationship status: Not on file  . Intimate partner violence:    Fear of current or ex partner: Not on file    Emotionally abused: Not on file    Physically abused: Not on file    Forced sexual activity: Not on file  Other Topics Concern  . Not on file  Social History Narrative  . Not on file    ALLERGIES: Patient has no known allergies.  MEDICATIONS:  Current Outpatient Medications  Medication Sig Dispense Refill  . Cod Liver Oil CAPS Take by mouth.    . meloxicam (MOBIC) 7.5 MG tablet Take 7.5 mg by mouth daily. Manage arthritic knee pain    . Multiple Vitamin (MULTI-VITAMINS) TABS Take by mouth.    . Omega-3 1000 MG CAPS Take by mouth.    .  triamcinolone cream (KENALOG) 0.1 % Apply 1 application topically 2 (two) times daily. 30 g 0   No current facility-administered medications for this encounter.     REVIEW OF SYSTEMS:  On review of systems, the patient reports that he is doing well overall. He denies any chest pain, shortness of breath, cough, fevers, chills, night sweats, unintended weight changes. He denies any bowel disturbances, and denies abdominal pain, nausea or vomiting. He denies any new musculoskeletal or joint aches or pains. His IPSS was 1, indicating mild urinary symptoms. He is not currently sexually active, for the last 6 months. A complete review of systems is obtained and is otherwise negative.    PHYSICAL EXAM:  Wt Readings from Last 3 Encounters:  05/24/18 199 lb 6.4 oz (90.4 kg)  04/19/18 196 lb 3.2 oz (89 kg)  04/11/17 192 lb 3.2 oz (87.2 kg)   Temp Readings from Last 3 Encounters:  05/24/18 98.3 F (36.8 C) (Oral)  04/19/18 98 F (36.7 C) (Oral)  04/11/17 98 F (36.7 C) (Oral)   BP Readings from Last 3 Encounters:  05/24/18 (!) 143/89  04/19/18 133/86  04/11/17 134/79   Pulse Readings from Last 3 Encounters:  05/24/18 73  04/19/18 75  04/11/17 71   Pain Assessment Pain Score: 0-No pain/10  In general this is a well appearing African American gentleman in no acute distress. He is alert and oriented x4 and appropriate throughout the examination. HEENT reveals that the patient is normocephalic, atraumatic. EOMs are intact. PERRLA. Skin is intact without any evidence of gross lesions. Cardiovascular exam reveals a regular rate and rhythm, no clicks rubs or murmurs are auscultated. Chest is clear to auscultation bilaterally. Lymphatic assessment is performed and does not reveal any adenopathy in the cervical, supraclavicular, axillary, or inguinal chains. Abdomen has active bowel sounds in all quadrants and is intact. The abdomen is soft, non tender, non distended. Lower extremities are negative  for pretibial pitting edema, deep calf tenderness, cyanosis or clubbing.   KPS = 100  100 - Normal; no complaints; no evidence of disease. 90   - Able to carry on normal activity; minor signs or symptoms of disease. 80   - Normal activity with effort; some signs or symptoms of disease. 68   - Cares for self; unable to carry on normal activity or to do active work. 60   - Requires occasional assistance, but is able to care for most of his personal needs. 50   - Requires  considerable assistance and frequent medical care. 90   - Disabled; requires special care and assistance. 64   - Severely disabled; hospital admission is indicated although death not imminent. 44   - Very sick; hospital admission necessary; active supportive treatment necessary. 10   - Moribund; fatal processes progressing rapidly. 0     - Dead  Karnofsky DA, Abelmann [place], Craver LS and Burchenal [facility] [phone]) The use of the nitrogen mustards in the palliative treatment of carcinoma: with particular reference to bronchogenic carcinoma Cancer 1 [phone]  LABORATORY DATA:  Lab Results  Component Value Date   WBC 6.1 09/22/2011   HGB 14.7 09/22/2011   HCT 43.9 09/22/2011   MCV 96.4 09/22/2011   No results found for: NA, K, CL, CO2 No results found for: ALT, AST, GGT, ALKPHOS, BILITOT   RADIOGRAPHY: No results found.    IMPRESSION/PLAN: 1. 67 y.o. gentleman with Stage T1c adenocarcinoma of the prostate with Gleason Score of 3+4, and PSA of 5.69. We discussed the patient's workup and outlined the nature of prostate cancer in this setting. The patient's T stage, Gleason's score, and PSA put him into the favorable intermediate risk group. Accordingly, he is eligible for a variety of potential treatment options including brachytherapy, 5.5 - 8 weeks of external radiation or 5 weeks of external radiation followed by a brachytherapy boost. We discussed the available radiation techniques, and focused on the details and logistics and  delivery. We discussed and outlined the risks, benefits, short and long-term effects associated with radiotherapy and compared and contrasted these with prostatectomy. We also discussed the role of SpaceOAR in reducing the rectal toxicity associated with radiotherapy.  He and his wife were encouraged to ask questions that were answered to their stated satisfaction.  At the conclusion of our discussion, the patient remains undecided regarding his preferred course of treatment and would like some additional time to reach his decision.  At this point, he appears to be leaning towards either prostatectomy or brachytherapy.  We will plan to follow up with him in the next 1-2 weeks and will move forward with treatment planning as appropriate at that time. He understands that if he elects to proceed with brachytherapy, we will need to arrange for a consult with one of the local urologists at [facility] to assist with this procedure.  He knows to contact us with any further questions or concerns.    Nicholos Johns, PA-C    Tyler Pita, MD  [facility] Direct Dial: [phone]  Fax: [phone] [email or web address]  Skype  [email or web address]  This document serves as a record of services personally performed by [employer], PA-C and Dr. Tammi Klippel. It was created on their behalf by Wilburn Mylar, a trained medical scribe. The creation of this record is based on the scribe's personal observations and the provider's statements to them. This document has been checked and approved by the attending provider.

## 2018-05-30 ENCOUNTER — Telehealth: Payer: Self-pay | Admitting: Medical Oncology

## 2018-05-30 NOTE — Telephone Encounter (Signed)
Spoke with Patrick Rich as follow post consult with Dr. Tammi Klippel. I introduced myself as the prostate nurse navigator and my role. I was unable to meet him the day he consulted. He states that he was so impressed with his care at [facility]. He felt very at ease with the nursing staff, and was very impressed with As[facility] and Dr. Tammi Klippel. He did not have any questions or concern and states he hopes to have his treatment decision by Friday.I encouraged him to call with questions or concerns. He voiced understanding.

## 2018-06-05 ENCOUNTER — Telehealth: Payer: Self-pay | Admitting: Radiation Oncology

## 2018-06-05 NOTE — Progress Notes (Signed)
Received voicemail message from patient requesting return call. Phoned patient back. He reports he has decided to move forward with prostate seed implant instead of surgery. Explained to the patient this RN will inform Dr. Tammi Klippel and Romie Jumper of these findings. Explained that Enid Derry will be in touch with next steps. Patient verbalized understanding and expressed appreciation for the return call.

## 2018-06-07 ENCOUNTER — Telehealth: Payer: Self-pay | Admitting: *Deleted

## 2018-06-07 NOTE — Telephone Encounter (Signed)
Called patient to inform of consultation with Dr. Karsten Ro on 06/12/18 - arrival time - 3:15 pm @ [facility], spoke with patient and he is aware of this appt.

## 2018-06-20 ENCOUNTER — Telehealth: Payer: Self-pay | Admitting: *Deleted

## 2018-06-20 NOTE — Telephone Encounter (Signed)
XXXXX

## 2018-06-27 ENCOUNTER — Other Ambulatory Visit: Payer: Self-pay | Admitting: Urology

## 2018-06-28 ENCOUNTER — Telehealth: Payer: Self-pay | Admitting: *Deleted

## 2018-06-28 NOTE — Telephone Encounter (Signed)
CALLED PATIENT TO INFORM OF PRE-SEED APPTS. AND IMPLANT, LVM FOR A RETURN CALL

## 2018-07-05 ENCOUNTER — Telehealth: Payer: Self-pay | Admitting: Radiation Oncology

## 2018-07-05 MED ORDER — SILDENAFIL CITRATE 25 MG PO TABS: 25.0000 mg | ORAL_TABLET | Freq: Every day | ORAL | 0 refills | Status: DC | PRN

## 2018-07-05 NOTE — Addendum Note (Signed)
Addended by: Tyler Pita on: 07/05/2018 06:36 PM   Modules accepted: Orders

## 2018-07-05 NOTE — Telephone Encounter (Signed)
Sam, He met with Dr. Karsten Ro at [facility] who will be participating in his upcoming brachytherapy.  He will need to contact [facility] to obtain Rx for ED medication as we are only treating/managing the prostate cancer, not ED.  I feel certain that Dr. Karsten Ro will be happy to provide and monitor appropriate ED treatment. -Mareesa Gathright

## 2018-07-05 NOTE — Telephone Encounter (Signed)
Received voicemail message from patient requesting return call. Patient states, I am getting married and I want medication for ED." Encouraged patient to contact his [place] urologist to obtain this script. Patient reports he did and they deferred to Dr. Tammi Klippel. Patient expresses his need to get this medication asap. Patient states, "I will even pay for it myself." Patient request the script be [facility] to [facility] [place]. Patient understands this RN will call him back with an update.

## 2018-07-06 ENCOUNTER — Telehealth: Payer: Self-pay | Admitting: Radiation Oncology

## 2018-07-06 NOTE — Progress Notes (Signed)
Phoned patient. Explained Dr. Tammi Klippel has escribed 10 Viagra tablets to [facility] [place]. Furthermore, I explained that any future refills of this medication would need to come from Dr. Karsten Ro at [facility]. Provided patient with Dr. Simone Curia office number. Patient verbalized understanding and expressed great appreciation for the assistance.

## 2018-07-11 ENCOUNTER — Telehealth: Payer: Self-pay | Admitting: *Deleted

## 2018-07-11 NOTE — Telephone Encounter (Signed)
Called patient to remind of pre-seed planning CT and chest x-ray and EKG, lvm for a return call

## 2018-07-11 NOTE — Progress Notes (Signed)
  Radiation Oncology         [phone]) [phone] ________________________________  Name: Clent Ridges Sr. MRN: [id number]  Date: 07/12/2018  DOB: Aug 09, 1951  SIMULATION AND TREATMENT PLANNING NOTE PUBIC ARCH STUDY  [id number], No Pcp Per  Delorise Shiner, MD  DIAGNOSIS: 68 y.o. gentleman with Stage T1c adenocarcinoma of the prostate with Gleason Score of 3+4, and PSA of 5.69.     ICD-10-CM   1. Malignant neoplasm of prostate ([place]) C61     COMPLEX SIMULATION:  The patient presented today for evaluation for possible prostate seed implant. He was brought to the radiation planning suite and placed supine on the CT couch. A 3-dimensional image study set was obtained in upload to the planning computer. There, on each axial slice, I contoured the prostate gland. Then, using three-dimensional radiation planning tools I reconstructed the prostate in view of the structures from the transperineal needle pathway to assess for possible pubic arch interference. In doing so, I did not appreciate any pubic arch interference. Also, the patient's prostate volume was estimated based on the drawn structure. The volume was 42 cc.  Given the pubic arch appearance and prostate volume, patient remains a good candidate to proceed with prostate seed implant. Today, he freely provided informed written consent to proceed.    PLAN: The patient will undergo definitive prostate seed implant to 145 Gy.   ________________________________  Sheral Apley. Tammi Klippel, M.D.

## 2018-07-12 ENCOUNTER — Ambulatory Visit
Admission: RE | Admit: 2018-07-12 | Discharge: 2018-07-12 | Disposition: A | Discharge status: Home / Self Care | Payer: No Typology Code available for payment source | Source: Ambulatory Visit | Attending: Radiation Oncology | Admitting: Radiation Oncology

## 2018-07-12 ENCOUNTER — Ambulatory Visit (HOSPITAL_COMMUNITY)
Admission: RE | Admit: 2018-07-12 | Discharge: 2018-07-12 | Disposition: A | Discharge status: Home / Self Care | Payer: No Typology Code available for payment source | Source: Ambulatory Visit | Attending: Urology | Admitting: Urology

## 2018-07-12 ENCOUNTER — Ambulatory Visit
Admission: RE | Admit: 2018-07-12 | Payer: No Typology Code available for payment source | Source: Ambulatory Visit | Admitting: Radiation Oncology

## 2018-07-12 ENCOUNTER — Encounter (HOSPITAL_COMMUNITY)
Admission: RE | Admit: 2018-07-12 | Discharge: 2018-07-12 | Disposition: A | Discharge status: Home / Self Care | Payer: No Typology Code available for payment source | Source: Ambulatory Visit | Attending: Urology | Admitting: Urology

## 2018-07-12 DIAGNOSIS — C61 Malignant neoplasm of prostate: Secondary | ICD-10-CM | POA: Insufficient documentation

## 2018-08-03 ENCOUNTER — Telehealth: Payer: Self-pay | Admitting: Medical Oncology

## 2018-08-03 NOTE — Telephone Encounter (Signed)
Patient called stating Dr. Tammi Klippel e[facility] When he went to pick it up, he was told his insurance did not cover. A friend told him  [facility] will cover if we call it in.  I explained it is not the pharmacy but his insurance. I suggested he call Dr. Simone Curia office to discuss. They are able to prescribe a generic Viagra that they dispense. I also asked him to reach out to the [place] to see if they will cover. He voiced understanding.

## 2018-08-11 ENCOUNTER — Other Ambulatory Visit: Payer: Self-pay | Admitting: Urology

## 2018-08-11 DIAGNOSIS — C61 Malignant neoplasm of prostate: Secondary | ICD-10-CM

## 2018-09-01 ENCOUNTER — Telehealth: Payer: Self-pay | Admitting: *Deleted

## 2018-09-01 NOTE — Telephone Encounter (Signed)
CALLED PATIENT TO REMIND OF LABS FOR 09-04-18 - ARRIVAL TIME- 10:45 AM @ [facility] ADMITTING, SPOKE WITH PATIENT AND HE IS AWARE OF THIS APPT.

## 2018-09-04 ENCOUNTER — Encounter (HOSPITAL_COMMUNITY)
Admission: RE | Admit: 2018-09-04 | Discharge: 2018-09-04 | Disposition: A | Discharge status: Home / Self Care | Payer: Medicare Other | Source: Ambulatory Visit | Attending: Urology | Admitting: Urology

## 2018-09-04 DIAGNOSIS — Z01812 Encounter for preprocedural laboratory examination: Secondary | ICD-10-CM | POA: Diagnosis not present

## 2018-09-04 LAB — COMPREHENSIVE METABOLIC PANEL
ALT: 14 U/L (ref 0–44)
AST: 22 U/L (ref 15–41)
Albumin: 4.1 g/dL (ref 3.5–5.0)
Alkaline Phosphatase: 86 U/L (ref 38–126)
Anion gap: 6 (ref 5–15)
BUN: 13 mg/dL (ref 8–23)
CO2: 25 mmol/L (ref 22–32)
Calcium: 9.2 mg/dL (ref 8.9–10.3)
Chloride: 108 mmol/L (ref 98–111)
Creatinine, Ser: 1.19 mg/dL (ref 0.61–1.24)
GFR calc Af Amer: >60 mL/min (ref >60)
GFR calc non Af Amer: >60 mL/min (ref >60)
Glucose, Bld: 94 mg/dL (ref 70–99)
Potassium: 4.6 mmol/L (ref 3.5–5.1)
Sodium: 139 mmol/L (ref 135–145)
Total Bilirubin: 0.8 mg/dL (ref 0.3–1.2)
Total Protein: 7 g/dL (ref 6.5–8.1)

## 2018-09-04 LAB — CBC
HCT: 45.4 % (ref 39.0–52.0)
Hemoglobin: 14.8 g/dL (ref 13.0–17.0)
MCH: 30.6 pg (ref 26.0–34.0)
MCHC: 32.6 g/dL (ref 30.0–36.0)
MCV: 93.8 fL (ref 80.0–100.0)
Platelets: 219 10*3/uL (ref 150–400)
RBC: 4.84 MIL/uL (ref 4.22–5.81)
RDW: 12.5 % (ref 11.5–15.5)
WBC: 5 10*3/uL (ref 4.0–10.5)
nRBC: 0 % (ref 0.0–0.2)

## 2018-09-04 LAB — APTT: aPTT: 29 seconds (ref 24–36)

## 2018-09-04 LAB — PROTIME-INR
INR: 0.88
Prothrombin Time: 11.9 seconds (ref 11.4–15.2)

## 2018-09-08 ENCOUNTER — Other Ambulatory Visit: Payer: Self-pay

## 2018-09-08 ENCOUNTER — Encounter (HOSPITAL_COMMUNITY): Payer: Self-pay | Admitting: *Deleted

## 2018-09-08 ENCOUNTER — Telehealth: Payer: Self-pay | Admitting: *Deleted

## 2018-09-08 NOTE — Telephone Encounter (Signed)
CALLED PATIENT TO REMIND OF IMPLANT FOR 09-11-18, SPOKE WITH PATIENT AND HE IS AWARE OF THIS IMPLANT

## 2018-09-10 NOTE — Discharge Instructions (Signed)
DISCHARGE INSTRUCTIONS FOR PROSTATE SEED IMPLANTATION ° °Antibiotics °You may be given a prescription for an antibiotic to take when you arrive home. If so, be sure to take every tablet in the bottle, even if you are feeling better before the prescription is finished. If you begin itching, notice a rash or start to swell on your trunk, arms, legs and/or throat, immediately stop taking the antibiotic and call your Urologist. °Diet °Resume your usual diet when you return home. To keep your bowels moving easily and softly, drink prune, apple and cranberry juice at room temperature. You may also take a stool softener, such as Colace, which is available without prescription at local pharmacies. °Daily activities °? No driving or heavy lifting for at least two days after the implant. °? No bike riding, horseback riding or riding lawn mowers for the first month after the implant. °? Any strenuous physical activity should be approved by your doctor before you resume it. °Sexual relations °You may resume sexual relations two weeks after the procedure. A condom should be used for the first two weeks. Your semen may be dark brown or black; this is normal and is related bleeding that may have occurred during the implant. °Postoperative swelling °Expect swelling and bruising of the scrotum and perineum (the area between the scrotum and anus). Both the swelling and the bruising should resolve in l or 2 weeks. Ice packs and over- the-counter medications such as Tylenol, Advil or Aleve may lessen your discomfort. °Postoperative urination °Most men experience burning on urination and/or urinary frequency. If this becomes bothersome, contact your Urologist.  Medication can be prescribed to relieve these problems.  It is normal to have some blood in your urine for a few days after the implant. °Special instructions related to the seeds °It is unlikely that you will pass an Iodine-125 seed in your urine. The seeds are silver in color  and are about as large as a grain of rice. If you pass a seed, do not handle it with your fingers. Use a spoon to place it in an envelope or jar in place this in base occluded area such as the garage or basement for return to the radiation clinic at your convenience. ° °Contact your doctor for °? Temperature greater than 101 F °? Increasing pain °? Inability to urinate °Follow-up ° You should have follow up with your urologist and radiation oncologist about 3 weeks after the procedure. °General information regarding Iodine seeds °? Iodine-125 is a low energy radioactive material. It is not deeply penetrating and loses energy at short distances. Your prostate will absorb the radiation. Objects that are touched or used by the patient do not become radioactive. °? Body wastes (urine and stool) or body fluids (saliva, tears, semen or blood) are not radioactive. °? The Nuclear Regulatory Commission (NRC) has determined that no radiation precautions are needed for patients undergoing Iodine-125 seed implantation. The NRC states that such patients do not present a risk to the people around them, including young children and pregnant women. However, in keeping with the general principle that radiation exposure should be kept as low reasonably possible, we suggest the following: °? Children and pets should not sit on the patient's lap for the first two (2) weeks after the implant. °? Pregnant (or possibly pregnant) women should avoid prolonged, close contact with the patient for the first two (2) weeks after the implant. °? A distance of three (3) feet is acceptable. °? At a distance of three (3)   feet, there is no limit to the length of time anyone can be with the patient. °?  ° °
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
## 2018-09-10 NOTE — Progress Notes (Signed)
  Radiation Oncology         [phone]) [phone] ________________________________  Name: Clent Ridges Sr. MRN: [id number]  Date: 09/10/2018  DOB: [birth year]       Prostate Seed Implant  WK:[id number], [facility]  No ref. provider found  DIAGNOSIS:  68 y.o. gentleman with Stage T1c adenocarcinoma of the prostate with Gleason Score of 3+4, and PSA of 5.69.    ICD-10-CM   1. Prostate cancer ([place]) C61 DG Chest 2 View    DG Chest 2 View    PROCEDURE: Insertion of radioactive I-125 seeds into the prostate gland.  RADIATION DOSE: 145 Gy, definitive therapy.  TECHNIQUE: MIKI BLANK Sr. was brought to the operating room with the urologist. He was placed in the dorsolithotomy position. He was catheterized and a rectal tube was inserted. The perineum was shaved, prepped and draped. The ultrasound probe was then introduced into the rectum to see the prostate gland.  TREATMENT DEVICE: A needle grid was attached to the ultrasound probe stand and anchor needles were placed.  3D PLANNING: The prostate was imaged in 3D using a sagittal sweep of the prostate probe. These images were transferred to the planning computer. There, the prostate, urethra and rectum were defined on each axial reconstructed image. Then, the software created an optimized 3D plan and a few seed positions were adjusted. The quality of the plan was reviewed using [facility] information for the target and the following two organs at risk:  Urethra and Rectum.  Then the accepted plan was printed and handed off to the radiation therapist.  Under my supervision, the custom loading of the seeds and spacers was carried out and loaded into sealed vicryl sleeves.  These pre-loaded needles were then placed into the needle holder.Marland Kitchen  PROSTATE VOLUME STUDY:  Using transrectal ultrasound the volume of the prostate was verified to be 41.6 cc.  SPECIAL TREATMENT PROCEDURE/SUPERVISION AND HANDLING: The pre-loaded needles were then delivered under sagittal  guidance. A total of 24 needles were used to deposit 64 seeds in the prostate gland. The individual seed activity was 0.507 mCi.  SpaceOAR:  Yes  COMPLEX SIMULATION: At the end of the procedure, an anterior radiograph of the pelvis was obtained to document seed positioning and count. Cystoscopy was performed to check the urethra and bladder.  MICRODOSIMETRY: At the end of the procedure, the patient was emitting 0.08 mR/hr at 1 meter. Accordingly, he was considered safe for hospital discharge.  PLAN: The patient will return to the radiation oncology clinic for post implant CT dosimetry in three weeks.   ________________________________  Sheral Apley Tammi Klippel, M.D.

## 2018-09-11 ENCOUNTER — Ambulatory Visit (HOSPITAL_COMMUNITY): Payer: No Typology Code available for payment source | Admitting: Certified Registered Nurse Anesthetist

## 2018-09-11 ENCOUNTER — Encounter (HOSPITAL_COMMUNITY)
Admission: RE | Disposition: A | Discharge status: Home / Self Care | Payer: Self-pay | Source: Ambulatory Visit | Attending: Urology

## 2018-09-11 ENCOUNTER — Ambulatory Visit (HOSPITAL_COMMUNITY)
Admission: RE | Admit: 2018-09-11 | Discharge: 2018-09-11 | Disposition: A | Discharge status: Home / Self Care | Payer: No Typology Code available for payment source | Source: Ambulatory Visit | Attending: Urology | Admitting: Urology

## 2018-09-11 ENCOUNTER — Encounter (HOSPITAL_COMMUNITY): Payer: Self-pay | Admitting: *Deleted

## 2018-09-11 ENCOUNTER — Ambulatory Visit (HOSPITAL_COMMUNITY): Payer: No Typology Code available for payment source

## 2018-09-11 DIAGNOSIS — Z79899 Other long term (current) drug therapy: Secondary | ICD-10-CM | POA: Insufficient documentation

## 2018-09-11 DIAGNOSIS — Z791 Long term (current) use of non-steroidal anti-inflammatories (NSAID): Secondary | ICD-10-CM | POA: Insufficient documentation

## 2018-09-11 DIAGNOSIS — C61 Malignant neoplasm of prostate: Secondary | ICD-10-CM | POA: Diagnosis present

## 2018-09-11 HISTORY — PX: RADIOACTIVE SEED IMPLANT: SHX5150

## 2018-09-11 HISTORY — PX: SPACE OAR INSTILLATION: SHX6769

## 2018-09-11 HISTORY — PX: CYSTOSCOPY: SHX5120

## 2018-09-11 SURGERY — INSERTION, RADIATION SOURCE, PROSTATE
Anesthesia: General

## 2018-09-11 MED ORDER — EPHEDRINE 5 MG/ML INJ
INTRAVENOUS | Status: AC
  Filled 2018-09-11: qty 10

## 2018-09-11 MED ORDER — ONDANSETRON HCL 4 MG/2ML IJ SOLN: 4.0000 mg | Freq: Once | INTRAMUSCULAR | Status: DC | PRN

## 2018-09-11 MED ORDER — MIDAZOLAM HCL 5 MG/5ML IJ SOLN
INTRAMUSCULAR | Status: DC | PRN
  Administered 2018-09-11: 2 mg via INTRAVENOUS

## 2018-09-11 MED ORDER — KETOROLAC TROMETHAMINE 30 MG/ML IJ SOLN
INTRAMUSCULAR | Status: AC
  Administered 2018-09-11: 30 mg via INTRAVENOUS
  Filled 2018-09-11: qty 1

## 2018-09-11 MED ORDER — HYDROCODONE-ACETAMINOPHEN 10-325 MG PO TABS: 1.0000 | ORAL_TABLET | ORAL | 0 refills | Status: AC | PRN

## 2018-09-11 MED ORDER — ACETAMINOPHEN 160 MG/5ML PO SOLN: 325.0000 mg | ORAL | Status: DC | PRN

## 2018-09-11 MED ORDER — ACETAMINOPHEN 325 MG PO TABS: 325.0000 mg | ORAL_TABLET | ORAL | Status: DC | PRN

## 2018-09-11 MED ORDER — KETOROLAC TROMETHAMINE 30 MG/ML IJ SOLN
30.0000 mg | Freq: Once | INTRAMUSCULAR | Status: AC | PRN
  Administered 2018-09-11: 30 mg via INTRAVENOUS

## 2018-09-11 MED ORDER — MIDAZOLAM HCL 2 MG/2ML IJ SOLN
INTRAMUSCULAR | Status: AC
  Filled 2018-09-11: qty 2

## 2018-09-11 MED ORDER — FENTANYL CITRATE (PF) 100 MCG/2ML IJ SOLN
INTRAMUSCULAR | Status: AC
  Filled 2018-09-11: qty 2

## 2018-09-11 MED ORDER — PROPOFOL 10 MG/ML IV BOLUS
INTRAVENOUS | Status: AC
  Filled 2018-09-11: qty 20

## 2018-09-11 MED ORDER — FENTANYL CITRATE (PF) 100 MCG/2ML IJ SOLN
25.0000 ug | INTRAMUSCULAR | Status: DC | PRN
  Administered 2018-09-11: 25 ug via INTRAVENOUS

## 2018-09-11 MED ORDER — DEXAMETHASONE SODIUM PHOSPHATE 10 MG/ML IJ SOLN
INTRAMUSCULAR | Status: DC | PRN
  Administered 2018-09-11: 10 mg via INTRAVENOUS

## 2018-09-11 MED ORDER — EPHEDRINE SULFATE-NACL 50-0.9 MG/10ML-% IV SOSY
PREFILLED_SYRINGE | INTRAVENOUS | Status: DC | PRN
  Administered 2018-09-11 (×2): 10 mg via INTRAVENOUS

## 2018-09-11 MED ORDER — OXYCODONE HCL 5 MG/5ML PO SOLN: 5.0000 mg | Freq: Once | ORAL | Status: DC | PRN

## 2018-09-11 MED ORDER — FLEET ENEMA 7-19 GM/118ML RE ENEM
1.0000 | ENEMA | Freq: Once | RECTAL | Status: DC
  Filled 2018-09-11: qty 1

## 2018-09-11 MED ORDER — STERILE WATER FOR IRRIGATION IR SOLN
Status: DC | PRN
  Administered 2018-09-11: 3000 mL via INTRAVESICAL

## 2018-09-11 MED ORDER — LIDOCAINE 2% (20 MG/ML) 5 ML SYRINGE
INTRAMUSCULAR | Status: DC | PRN
  Administered 2018-09-11: 60 mg via INTRAVENOUS

## 2018-09-11 MED ORDER — PROPOFOL 10 MG/ML IV BOLUS
INTRAVENOUS | Status: DC | PRN
  Administered 2018-09-11: 40 mg via INTRAVENOUS
  Administered 2018-09-11: 160 mg via INTRAVENOUS

## 2018-09-11 MED ORDER — LACTATED RINGERS IV SOLN
INTRAVENOUS | Status: DC
  Administered 2018-09-11 (×2): via INTRAVENOUS

## 2018-09-11 MED ORDER — FENTANYL CITRATE (PF) 100 MCG/2ML IJ SOLN
INTRAMUSCULAR | Status: DC | PRN
  Administered 2018-09-11: 50 ug via INTRAVENOUS
  Administered 2018-09-11: 100 ug via INTRAVENOUS

## 2018-09-11 MED ORDER — ONDANSETRON HCL 4 MG/2ML IJ SOLN
INTRAMUSCULAR | Status: DC | PRN
  Administered 2018-09-11: 4 mg via INTRAVENOUS

## 2018-09-11 MED ORDER — OXYCODONE HCL 5 MG PO TABS: 5.0000 mg | ORAL_TABLET | Freq: Once | ORAL | Status: DC | PRN

## 2018-09-11 MED ORDER — CIPROFLOXACIN IN D5W 400 MG/200ML IV SOLN
400.0000 mg | INTRAVENOUS | Status: DC
  Filled 2018-09-11: qty 200

## 2018-09-11 MED ORDER — SODIUM CHLORIDE (PF) 0.9 % IJ SOLN
INTRAMUSCULAR | Status: AC
  Filled 2018-09-11: qty 10

## 2018-09-11 MED ORDER — IOHEXOL 300 MG/ML  SOLN
INTRAMUSCULAR | Status: DC | PRN
  Administered 2018-09-11: 7 mL

## 2018-09-11 MED ORDER — FENTANYL CITRATE (PF) 100 MCG/2ML IJ SOLN
INTRAMUSCULAR | Status: AC
  Administered 2018-09-11: 25 ug via INTRAVENOUS
  Filled 2018-09-11: qty 2

## 2018-09-11 MED ORDER — SODIUM CHLORIDE 0.9% FLUSH
INTRAVENOUS | Status: DC | PRN
  Administered 2018-09-11: 10 mL

## 2018-09-11 MED ORDER — MEPERIDINE HCL 50 MG/ML IJ SOLN: 6.2500 mg | INTRAMUSCULAR | Status: DC | PRN

## 2018-09-11 SURGICAL SUPPLY — 29 items
BAG URINE DRAINAGE (UROLOGICAL SUPPLIES) ×3 IMPLANT
BLADE CLIPPER SURG (BLADE) ×3 IMPLANT
CATH FOLEY 2WAY SLVR  5CC 16FR (CATHETERS) ×1
CATH FOLEY 2WAY SLVR 5CC 16FR (CATHETERS) ×4 IMPLANT
CATH ROBINSON RED A/P 20FR (CATHETERS) ×3 IMPLANT
CONT SPEC 4OZ CLIKSEAL STRL BL (MISCELLANEOUS) ×4 IMPLANT
COVER BACK TABLE 60X90IN (DRAPES) ×5 IMPLANT
COVER MAYO STAND STRL (DRAPES) ×3 IMPLANT
COVER SURGICAL LIGHT HANDLE (MISCELLANEOUS) ×2 IMPLANT
COVER WAND RF STERILE (DRAPES) IMPLANT
DRSG TEGADERM 4X4.75 (GAUZE/BANDAGES/DRESSINGS) ×2 IMPLANT
DRSG TEGADERM 8X12 (GAUZE/BANDAGES/DRESSINGS) ×3 IMPLANT
GAUZE SPONGE 4X4 12PLY STRL (GAUZE/BANDAGES/DRESSINGS) ×1 IMPLANT
GLOVE BIO SURGEON STRL SZ8 (GLOVE) ×3 IMPLANT
GOWN STRL REUS W/ TWL XL LVL3 (GOWN DISPOSABLE) ×4 IMPLANT
GOWN STRL REUS W/TWL XL LVL3 (GOWN DISPOSABLE) ×8 IMPLANT
HOLDER FOLEY CATH W/STRAP (MISCELLANEOUS) ×2 IMPLANT
I-Seed AgX100 Radionuclide Brachytherapy Source ×1 IMPLANT
IMPL SPACEOAR SYSTEM 10ML (Spacer) IMPLANT
IMPLANT SPACEOAR SYSTEM 10ML (Spacer) ×3 IMPLANT
KIT BASIN OR (CUSTOM PROCEDURE TRAY) ×3 IMPLANT
MARKER SKIN DUAL TIP RULER LAB (MISCELLANEOUS) ×3 IMPLANT
PACK CYSTO (CUSTOM PROCEDURE TRAY) ×3 IMPLANT
SYR 10ML LL (SYRINGE) ×3 IMPLANT
TOWEL OR 17X26 10 PK STRL BLUE (TOWEL DISPOSABLE) ×5 IMPLANT
UNDERPAD 30X30 (UNDERPADS AND DIAPERS) ×6 IMPLANT
WATER STERILE IRR 1000ML POUR (IV SOLUTION) ×2 IMPLANT
WATER STERILE IRR 1000ML UROMA (IV SOLUTION) ×2 IMPLANT
WATER STERILE IRR 250ML POUR (IV SOLUTION) ×1 IMPLANT

## 2018-09-11 NOTE — Transfer of Care (Signed)
Immediate Anesthesia Transfer of Care Note  Patient: Patrick HOH Sr.  Procedure(s) Performed: RADIOACTIVE SEED IMPLANT/BRACHYTHERAPY IMPLANT (N/A ) SPACE OAR INSTILLATION (N/A ) CYSTOSCOPY FLEXIBLE  Patient Location: PACU  Anesthesia Type:General  Level of Consciousness: sedated, patient cooperative and responds to stimulation  Airway & Oxygen Therapy: Patient Spontanous Breathing and Patient connected to face mask oxygen  Post-op Assessment: Report given to RN and Post -op Vital signs reviewed and stable  Post vital signs: Reviewed and stable  Last Vitals:  Vitals Value Taken Time  BP    Temp    Pulse    Resp    SpO2      Last Pain:  Vitals:   09/11/18 1001  PainSc: 0-No pain         Complications: No apparent anesthesia complications

## 2018-09-11 NOTE — Anesthesia Preprocedure Evaluation (Signed)
Anesthesia Evaluation  Patient identified by MRN, date of birth, ID band Patient awake    Reviewed: Allergy & Precautions, NPO status , Patient's Chart, lab work & pertinent test results  Airway Mallampati: I       Dental no notable dental hx. (+) Teeth Intact   Pulmonary neg pulmonary ROS,    Pulmonary exam normal breath sounds clear to auscultation       Cardiovascular negative cardio ROS Normal cardiovascular exam Rhythm:Regular Rate:Normal     Neuro/Psych negative neurological ROS  negative psych ROS   GI/Hepatic negative GI ROS, Neg liver ROS,   Endo/Other  negative endocrine ROS  Renal/GU negative Renal ROS     Musculoskeletal   Abdominal Normal abdominal exam  (+)   Peds  Hematology negative hematology ROS (+)   Anesthesia Other Findings   Reproductive/Obstetrics                             Anesthesia Physical Anesthesia Plan  ASA: II  Anesthesia Plan: General   Post-op Pain Management:    Induction: Intravenous  PONV Risk Score and Plan: 3 and Ondansetron, Dexamethasone and Midazolam  Airway Management Planned: LMA  Additional Equipment:   Intra-op Plan:   Post-operative Plan: Extubation in OR  Informed Consent: I have reviewed the patients History and Physical, chart, labs and discussed the procedure including the risks, benefits and alternatives for the proposed anesthesia with the patient or authorized representative who has indicated his/her understanding and acceptance.     Dental advisory given  Plan Discussed with: CRNA  Anesthesia Plan Comments:         Anesthesia Quick Evaluation

## 2018-09-11 NOTE — Anesthesia Postprocedure Evaluation (Signed)
Anesthesia Post Note  Patient: Patrick MARTINO Sr.  Procedure(s) Performed: RADIOACTIVE SEED IMPLANT/BRACHYTHERAPY IMPLANT (N/A ) SPACE OAR INSTILLATION (N/A ) CYSTOSCOPY FLEXIBLE     Patient location during evaluation: PACU Anesthesia Type: General Level of consciousness: awake Pain management: pain level controlled Vital Signs Assessment: post-procedure vital signs reviewed and stable Respiratory status: spontaneous breathing Cardiovascular status: stable Postop Assessment: no apparent nausea or vomiting Anesthetic complications: no    Last Vitals:  Vitals:   09/11/18 1424 09/11/18 1437  BP: 128/89 (!) 149/93  Pulse: 65 76  Resp: 11 14  Temp: (!) 35.5 C (!) 36.4 C  SpO2: 100% 100%    Last Pain:  Vitals:   09/11/18 1437  PainSc: 0-No pain   Pain Goal:                   Huston Foley

## 2018-09-11 NOTE — Anesthesia Procedure Notes (Signed)
Procedure Name: LMA Insertion Performed by: Gean Maidens, CRNA Pre-anesthesia Checklist: Patient identified, Emergency Drugs available, Suction available and Patient being monitored Patient Re-evaluated:Patient Re-evaluated prior to induction Oxygen Delivery Method: Circle system utilized Preoxygenation: Pre-oxygenation with 100% oxygen Induction Type: IV induction Ventilation: Mask ventilation without difficulty LMA: LMA inserted LMA Size: 4.0 Number of attempts: 1 Placement Confirmation: positive ETCO2 and breath sounds checked- equal and bilateral Tube secured with: Tape Dental Injury: Teeth and Oropharynx as per pre-operative assessment

## 2018-09-11 NOTE — Op Note (Signed)
PATIENT:  Patrick Ridges Sr.  PRE-OPERATIVE DIAGNOSIS:  Adenocarcinoma of the prostate  POST-OPERATIVE DIAGNOSIS:  Same  PROCEDURE:  1. I-125 radioactive seed implantation 2. Cystoscopy  3. Placement of SpaceOAR  SURGEON:  Surgeon(s): Claybon Jabs  Radiation oncologist: Dr. Tyler Pita  ANESTHESIA:  General  EBL:  Minimal  DRAINS: None  INDICATION: Patrick Rich. is a 68 year old male with biopsy-proven adenocarcinoma of the prostate. He initially was diagnosed in 7/18 and was under active surveillance due to low risk disease but there was grade progression to Gleason 3+4 and therefore he has elected radioactive seed implantation.  Description of procedure: After informed consent the patient was brought to the major OR, placed on the table and administered general anesthesia. He was then moved to the modified lithotomy position with his perineum perpendicular to the floor. His perineum and genitalia were then sterilely prepped. An official timeout was then performed. A 16 French Foley catheter was then placed in the bladder and filled with dilute contrast, a rectal tube was placed in the rectum and the transrectal ultrasound probe was placed in the rectum and affixed to the stand. He was then sterilely draped.  Real time ultrasonography was used along with the seed planning software On[employer] Prostate vs. 4.2.2.4. This was used to develop the seed plan including the number of needles as well as number of seeds required for complete and adequate coverage.  The needles were then preloaded with seeds and spacers according to the previously developed plan.  Real-time ultrasonography was then used along with the previously developed plan to implant a total of 64 seeds using 24 needles. This proceeded without difficulty or complication.   I then proceeded with placement of SpaceOAR by introducing a needle with the bevel angled inferiorly approximately 2 cm superior to the anus. This  was angled downward and under direct ultrasound was placed within the space between the prostatic capsule and rectum. This was confirmed with a small amount of sterile saline injected and this was performed under direct ultrasound. I then attached the SpaceOAR to the needle and injected this in the space between the prostate and rectum with good placement noted.  A Foley catheter was then removed as well as the transrectal ultrasound probe and rectal probe. Flexible cystoscopy was then performed using the 17 French flexible scope which revealed a normal urethra throughout its length down to the sphincter which appeared intact. The prostatic urethra revealed bilobar hypertrophy but no evidence of obstruction, seeds, spacers or lesions. The bladder was then entered and fully and systematically inspected. The ureteral orifices were noted to be of normal configuration and position. The mucosa revealed no evidence of tumors. There were also no stones identified within the bladder. I noted no seeds or spacers on the floor of the bladder and retroflexion of the scope revealed no seeds protruding from the base of the prostate.  The cystoscope was then removed and the patient was awakened and taken to recovery room in stable and satisfactory condition. He tolerated procedure well and there were no intraoperative complications.

## 2018-09-11 NOTE — Anesthesia Postprocedure Evaluation (Signed)
Anesthesia Post Note  Patient: Patrick VERTZ Sr.  Procedure(s) Performed: RADIOACTIVE SEED IMPLANT/BRACHYTHERAPY IMPLANT (N/A ) SPACE OAR INSTILLATION (N/A ) CYSTOSCOPY FLEXIBLE     Anesthesia Post Evaluation  Last Vitals:  Vitals:   09/11/18 1424 09/11/18 1437  BP: 128/89 (!) 149/93  Pulse: 65 76  Resp: 11 14  Temp: (!) 35.5 C (!) 36.4 C  SpO2: 100% 100%    Last Pain:  Vitals:   09/11/18 1437  PainSc: 0-No pain   Pain Goal:                   Huston Foley

## 2018-09-13 ENCOUNTER — Encounter (HOSPITAL_COMMUNITY): Payer: Self-pay | Admitting: Urology

## 2018-09-19 NOTE — Progress Notes (Signed)
[name] Is here today for a follow-up appointment.  Burning-Dysuria, Patient denies any burning, patient just states that he has discomfort when urinating, he describes it as a dull pain.  Blood-Hematuria-Patient states that the first week after treatment he had some bleeding but the second week the bleeding has subsided.  Leakage-Patient denies  IPSS Score-23  Diarrhea- Patient states that he did experience diarrhea the first week of treatment. Patient states he has not had any since then.   Urologist Appt.- October 03, 2018  MRI scheduled-(09/28/2018)

## 2018-09-20 ENCOUNTER — Telehealth: Payer: Self-pay | Admitting: Radiation Oncology

## 2018-09-20 NOTE — Telephone Encounter (Signed)
-----   Message from Kerri Perches sent at 09/20/2018 10:04 AM EST ----- Regarding: PHONE CALL Hi Patrick Rich,   This patient has some questions, regarding restrictions for his implant, he had his implant on 09-11-18.   Patrick Rich phone number is [phone].  Ashlyn said if they need to talk to her, it is o.k.   Thanks,  [employer]

## 2018-09-20 NOTE — Telephone Encounter (Signed)
Patient called back and reports he spoke directly with Dr. Karsten Ro. Patient denies any additional questions or needs at this time. Patient verbalized appreciation for my return call.

## 2018-09-20 NOTE — Telephone Encounter (Signed)
Received an inbasket that the patient has questions about restrictions and his recent implant. Phoned patient to inquire. No answer. Left message with contact number requesting a return call.

## 2018-09-27 ENCOUNTER — Telehealth: Payer: Self-pay | Admitting: *Deleted

## 2018-09-27 NOTE — Telephone Encounter (Signed)
Called patient to remind of post seed appts. and MRI, spoke with patient and he is aware of these appts.

## 2018-09-28 ENCOUNTER — Ambulatory Visit
Admission: RE | Admit: 2018-09-28 | Discharge: 2018-09-28 | Disposition: A | Discharge status: Home / Self Care | Payer: No Typology Code available for payment source | Source: Ambulatory Visit | Attending: Urology | Admitting: Urology

## 2018-09-28 ENCOUNTER — Ambulatory Visit (HOSPITAL_COMMUNITY)
Admission: RE | Admit: 2018-09-28 | Discharge: 2018-09-28 | Disposition: A | Discharge status: Home / Self Care | Payer: No Typology Code available for payment source | Source: Ambulatory Visit | Attending: Urology | Admitting: Urology

## 2018-09-28 ENCOUNTER — Other Ambulatory Visit: Payer: Self-pay

## 2018-09-28 ENCOUNTER — Encounter: Payer: Self-pay | Admitting: Urology

## 2018-09-28 ENCOUNTER — Encounter: Payer: Self-pay | Admitting: Medical Oncology

## 2018-09-28 ENCOUNTER — Ambulatory Visit
Admission: RE | Admit: 2018-09-28 | Discharge: 2018-09-28 | Disposition: A | Discharge status: Home / Self Care | Payer: No Typology Code available for payment source | Source: Ambulatory Visit | Attending: Radiation Oncology | Admitting: Radiation Oncology

## 2018-09-28 VITALS — BP 144/98 | HR 82 | Temp 98.2°F | Resp 20 | Ht 71.0 in | Wt 195.0 lb

## 2018-09-28 DIAGNOSIS — Z923 Personal history of irradiation: Secondary | ICD-10-CM | POA: Insufficient documentation

## 2018-09-28 DIAGNOSIS — C61 Malignant neoplasm of prostate: Secondary | ICD-10-CM | POA: Insufficient documentation

## 2018-09-28 NOTE — Progress Notes (Signed)
Radiation Oncology         [phone]) [phone] ________________________________  Name: Patrick Ridges Sr. MRN: [id number]  Date: 09/28/2018  DOB: 08-23-1950  Post-Seed Follow-Up Visit Note  CC: Administration, [place], [place], [place]  Diagnosis:   68 y.o. gentleman with Stage T1c adenocarcinoma of the prostate with Gleason Score of 3+4, and PSA of 5.69.    ICD-10-CM   1. Malignant neoplasm of prostate ([place]) C61     Interval Since Last Radiation:  2.5 weeks 09/11/18:  Insertion of radioactive I-125 seeds into the prostate gland; 145 Gy, definitive therapy and placement of SpaceOAR gel.  Narrative:  The patient returns today for routine follow-up.  He is complaining of increased urinary frequency, urgency, intermittency, weak stream and urinary hesitation symptoms. He filled out a questionnaire regarding urinary function today providing and overall IPSS score of 23 characterizing his symptoms as moderate-severe.  His pre-implant score was 1.  He reports a gradual improvement in his LUTS over the past week.  He specifically denies gross hematuria, dysuria, suprapubic discomfort or flank pain.  He has not had recent fevers, chills or night sweats.  He denies abdominal pain, nausea, vomiting, diarrhea or constipation.  He reports a healthy appetite and is maintaining his weight.  ALLERGIES:  has No Known Allergies.  Meds: Current Outpatient Medications  Medication Sig Dispense Refill  . Artificial Tear Solution (SOOTHE XP OP) Place 1 drop into both eyes at bedtime.    . Carboxymethylcellulose Sodium (THERATEARS OP) Place 1 drop into both eyes 2 (two) times daily as needed (for dry eyes).     . meloxicam (MOBIC) 15 MG tablet Take 15 mg by mouth daily as needed for pain.     . sildenafil (VIAGRA) 100 MG tablet Take 100 mg by mouth daily as needed for erectile dysfunction.    Marland Kitchen HYDROcodone-acetaminophen (NORCO) 10-325 MG tablet Take 1-2 tablets by mouth every 4 (four) hours as needed for moderate  pain. Maximum dose per 24 hours - 8 pills (Patient not taking: Reported on 09/28/2018) 10 tablet 0   No current facility-administered medications for this encounter.     Physical Findings: In general this is a well appearing African-American male in no acute distress.  He's alert and oriented x4 and appropriate throughout the examination. Cardiopulmonary assessment is negative for acute distress and he exhibits normal effort.   Lab Findings: Lab Results  Component Value Date   WBC 5.0 09/04/2018   HGB 14.8 09/04/2018   HCT 45.4 09/04/2018   MCV 93.8 09/04/2018   PLT 219 09/04/2018    Radiographic Findings:  Patient underwent CT imaging in our clinic for post implant dosimetry. The CT will be reviewed by Dr. Tammi Klippel to ensure an adequate distribution of radioactive seeds throughout the prostate gland and ensure that there are no seeds in or near the rectum.  He is scheduled for an MRI of the prostate later this afternoon and those images will be fused with his CT images for further evaluation.  We suspect the final radiation plan and dosimetry will show appropriate coverage of the prostate gland.  He understands that we will call him with any unanticipated findings on further evaluation.  Impression/Plan: 68 y.o. gentleman with Stage T1c adenocarcinoma of the prostate with Gleason Score of 3+4, and PSA of 5.69. The patient is recovering from the effects of radiation. His urinary symptoms should gradually improve over the next 4-6 months. We talked about this today. He is encouraged by his improvement  already and is otherwise pleased with his outcome. We also talked about long-term follow-up for prostate cancer following seed implant. He understands that ongoing PSA determinations and digital rectal exams will help perform surveillance to rule out disease recurrence. He has a follow up appointment scheduled with Florestine Avers, NP on October 03, 2018 and anticipates a follow-up visit with Dr.  Karsten Ro for repeat PSA around April 2020. He understands what to expect with his PSA measures. Patient was also educated today about some of the long-term effects from radiation including a small risk for rectal bleeding and possibly erectile dysfunction. We talked about some of the general management approaches to these potential complications. However, I did encourage the patient to contact our office or return at any point if he has questions or concerns related to his previous radiation and prostate cancer.    Nicholos Johns, PA-C

## 2018-09-29 NOTE — Progress Notes (Signed)
  Radiation Oncology         [phone]) [phone] ________________________________  Name: Patrick Ridges Sr. MRN: [id number]  Date: 09/28/2018  DOB: 08-08-1951  COMPLEX SIMULATION NOTE  NARRATIVE:  The patient was brought to the [facility] today following prostate seed implantation approximately one month ago.  Identity was confirmed.  All relevant records and images related to the planned course of therapy were reviewed.  Then, the patient was set-up supine.  CT images were obtained.  The CT images were loaded into the planning software.  Then the prostate and rectum were contoured.  Treatment planning then occurred.  The implanted iodine 125 seeds were identified by the physics staff for projection of radiation distribution  I have requested : 3D Simulation  I have requested a DVH of the following structures: Prostate and rectum.    ________________________________  Sheral Apley Tammi Klippel, M.D.

## 2018-10-05 ENCOUNTER — Encounter: Payer: Self-pay | Admitting: Radiation Oncology

## 2018-10-05 DIAGNOSIS — C61 Malignant neoplasm of prostate: Secondary | ICD-10-CM | POA: Diagnosis not present

## 2018-10-10 NOTE — Progress Notes (Signed)
  Radiation Oncology         [phone]) [phone] ________________________________  Name: Patrick Ridges Sr. MRN: [id number]  Date: 10/05/2018  DOB: [birth year]  3D Planning Note   Prostate Brachytherapy Post-Implant Dosimetry  Diagnosis: 68 y.o. gentleman with Stage T1c adenocarcinoma of the prostate with Gleason Score of 3+4, and PSA of 5.69.   Narrative: On a previous date, Patrick DAX Sr. returned following prostate seed implantation for post implant planning. He underwent CT scan complex simulation to delineate the three-dimensional structures of the pelvis and demonstrate the radiation distribution.  Since that time, the seed localization, and complex isodose planning with dose volume histograms have now been completed.  Results:   Prostate Coverage - The dose of radiation delivered to the 90% or more of the prostate gland (D90) was 112.59% of the prescription dose. This exceeds our goal of greater than 90%. Rectal Sparing - The volume of rectal tissue receiving the prescription dose or higher was 0.0 cc. This falls under our thresholds tolerance of 1.0 cc.  Impression: The prostate seed implant appears to show adequate target coverage and appropriate rectal sparing.  Plan:  The patient will continue to follow with urology for ongoing PSA determinations. I would anticipate a high likelihood for local tumor control with minimal risk for rectal morbidity.  ________________________________  Sheral Apley Tammi Klippel, M.D.

## 2019-10-24 ENCOUNTER — Telehealth: Payer: Self-pay | Admitting: *Deleted

## 2019-10-24 NOTE — Telephone Encounter (Signed)
Schedule AWV.  

## 2019-10-30 IMAGING — DX DG CHEST 2V
2 series · 2 of 2 positions shown · non-contrast
Comparison: 09/22/2011

CLINICAL DATA: Preop radioactive seed implant surgery

EXAM:
CHEST - 2 VIEW

[chest pa]
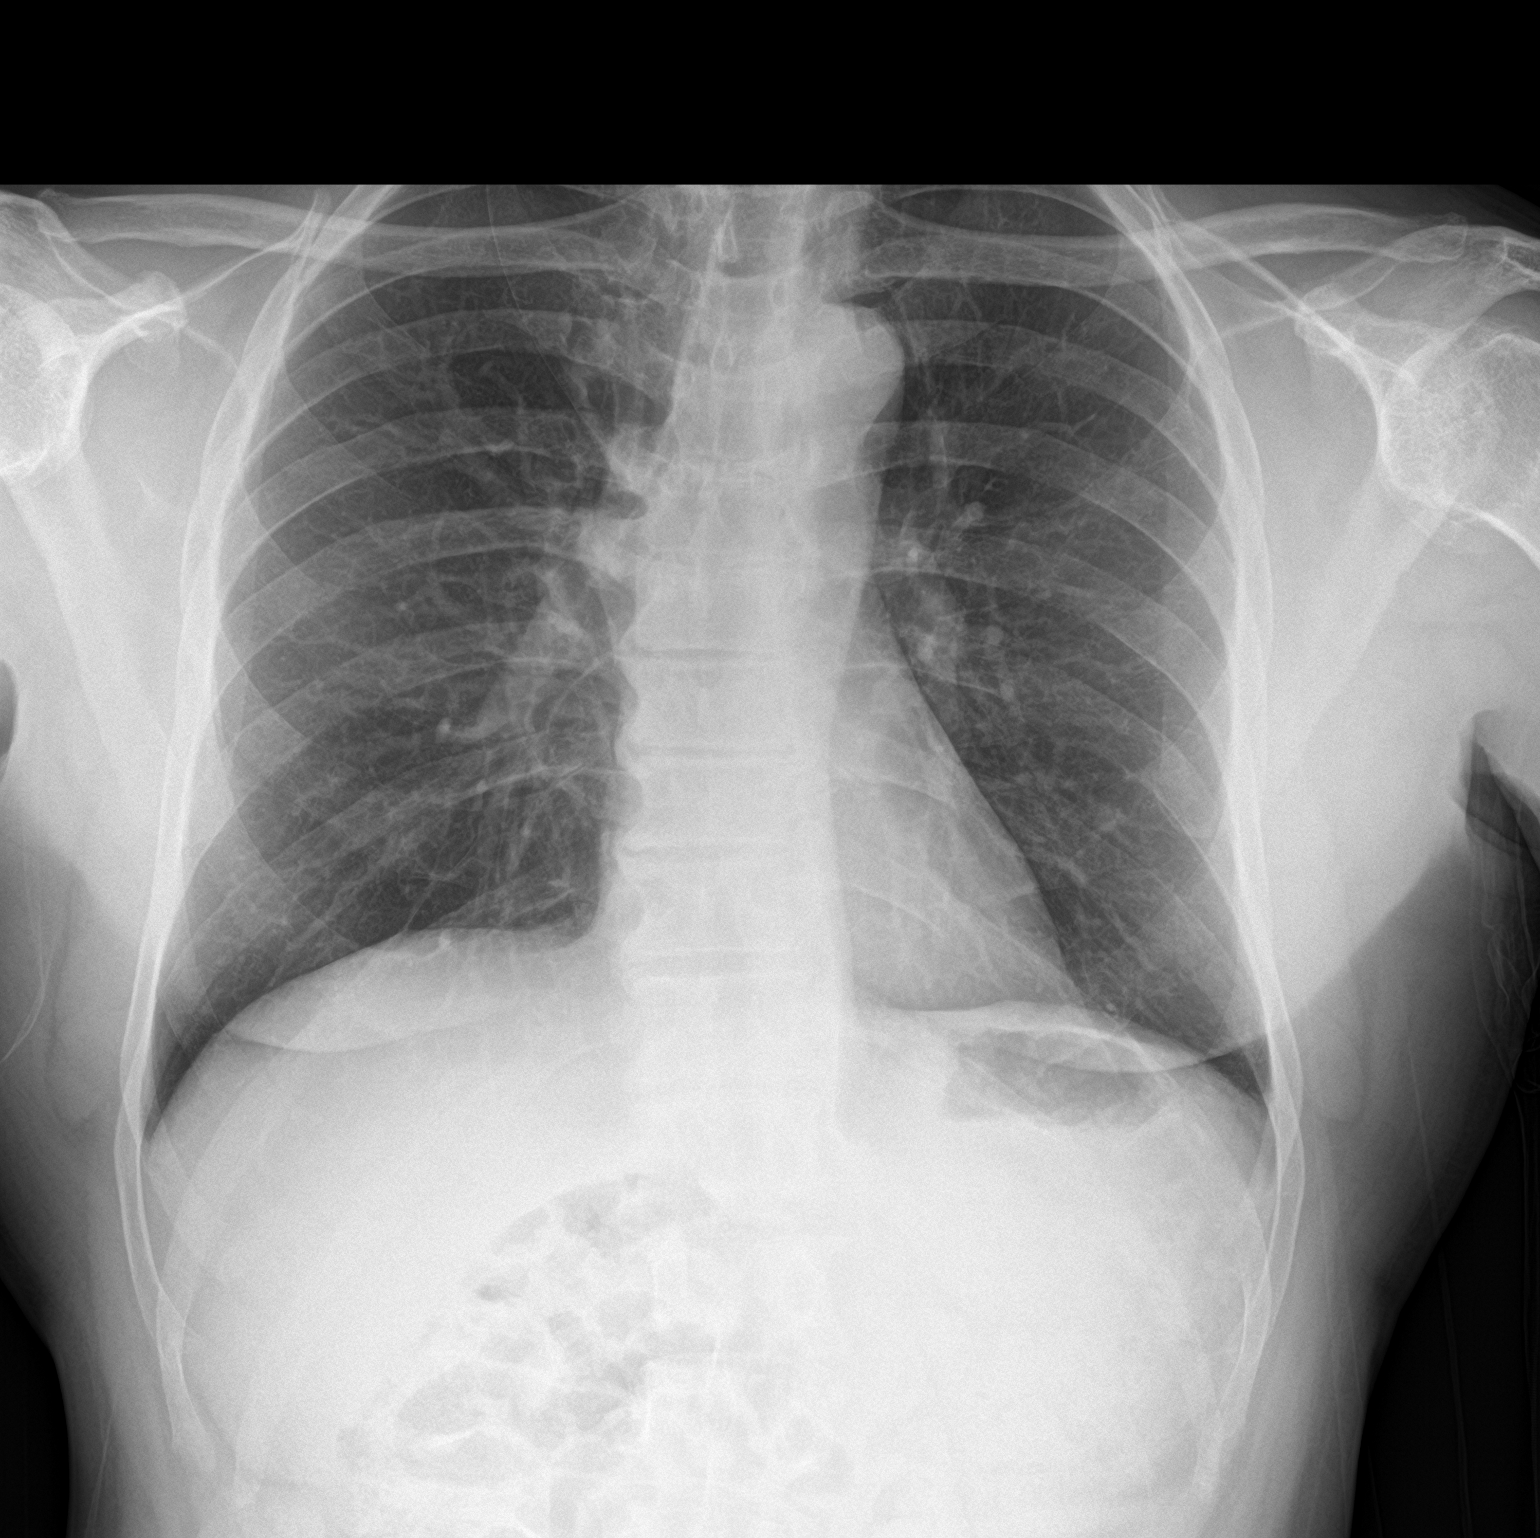

[chest lat]
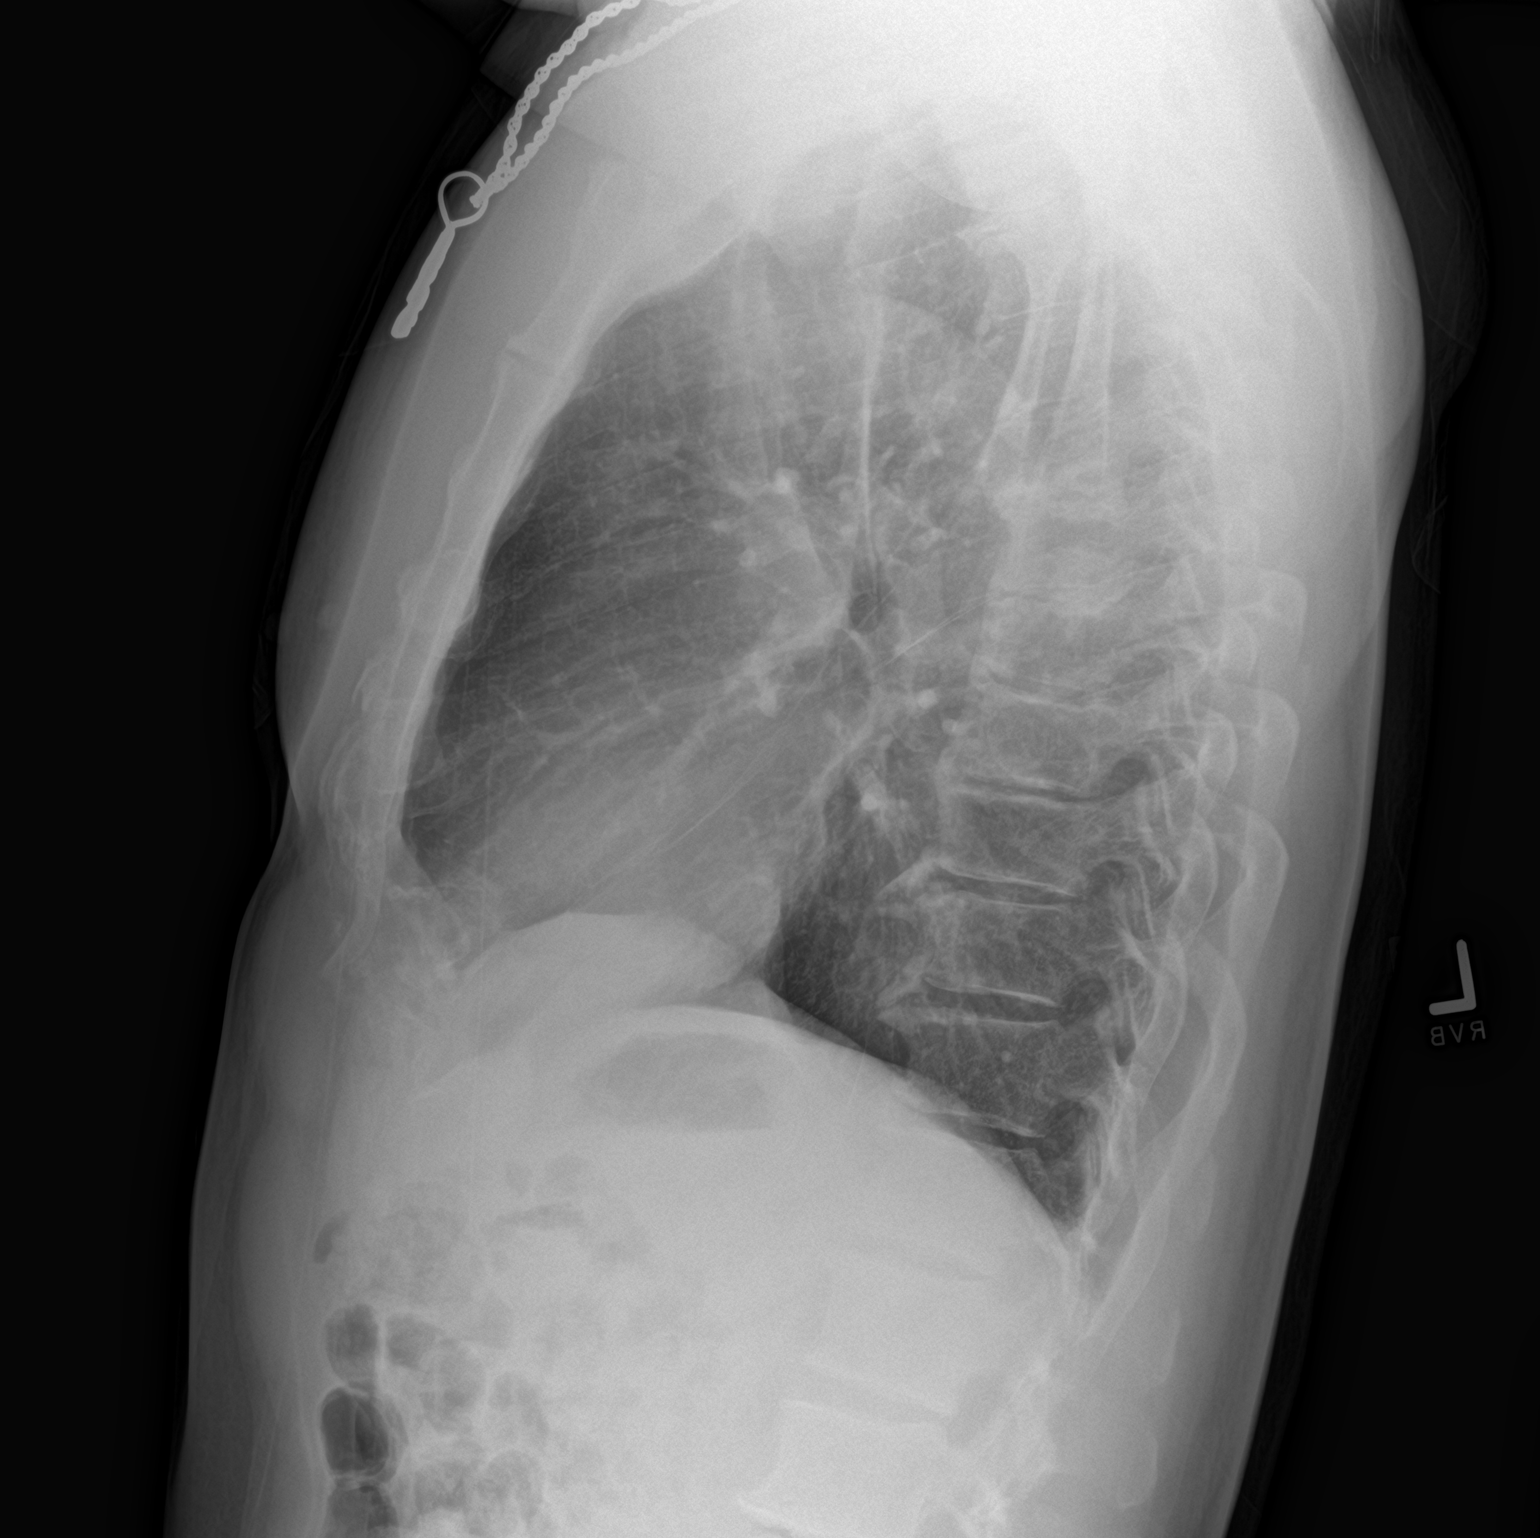

[2 of 2 positions shown; findings below may reference images not displayed]

FINDINGS: Lungs are clear.  No pleural effusion or pneumothorax.

The heart is normal in size.

Degenerative changes of the visualized thoracolumbar spine.
IMPRESSION: Normal chest radiographs.

## 2020-08-29 ENCOUNTER — Other Ambulatory Visit: Payer: Medicare Other

## 2020-08-29 DIAGNOSIS — Z20822 Contact with and (suspected) exposure to covid-19: Secondary | ICD-10-CM

## 2020-08-31 LAB — NOVEL CORONAVIRUS, NAA: SARS-CoV-2, NAA: NOT DETECTED

## 2020-08-31 LAB — SARS-COV-2, NAA 2 DAY TAT

## 2020-10-29 ENCOUNTER — Encounter: Payer: Self-pay | Admitting: Physical Therapy

## 2020-10-29 ENCOUNTER — Ambulatory Visit: Payer: No Typology Code available for payment source | Attending: Neurosurgery | Admitting: Physical Therapy

## 2020-10-29 ENCOUNTER — Other Ambulatory Visit: Payer: Self-pay

## 2020-10-29 DIAGNOSIS — M25611 Stiffness of right shoulder, not elsewhere classified: Secondary | ICD-10-CM | POA: Diagnosis present

## 2020-10-29 DIAGNOSIS — M25612 Stiffness of left shoulder, not elsewhere classified: Secondary | ICD-10-CM | POA: Diagnosis present

## 2020-10-29 DIAGNOSIS — M542 Cervicalgia: Secondary | ICD-10-CM | POA: Insufficient documentation

## 2020-10-29 DIAGNOSIS — R252 Cramp and spasm: Secondary | ICD-10-CM | POA: Insufficient documentation

## 2020-10-29 NOTE — Therapy (Signed)
[place]. [place], [place], [address] Phone: [phone]   Fax:  [phone]  Physical Therapy Evaluation  Patient Details  Name: Patrick COUTS Sr. MRN: [id number] Date of Birth: September 20, 1950 Referring Provider (PT): J.G. Marcello Moores   Encounter Date: 10/29/2020   PT End of Session - 10/29/20 0957    Visit Number 1    Number of Visits 15    Date for PT Re-Evaluation 01/29/21    Authorization Type [place]    PT Start Time 0920    PT Stop Time 1000    PT Time Calculation (min) 40 min    Activity Tolerance Patient tolerated treatment well    Behavior During Therapy [facility] for tasks assessed/performed           Past Medical History:  Diagnosis Date  . Cancer [facility]) July 20018   prostate  . Prostate cancer [facility])     Past Surgical History:  Procedure Laterality Date  . APPENDECTOMY  4th grade  . COLONOSCOPY  02/20/2013 and 2019  . CYSTOSCOPY  09/11/2018   Procedure: CYSTOSCOPY FLEXIBLE;  Surgeon: Kathie Rhodes, MD;  [facility];  Service: Urology;;  . PROSTATE BIOPSY     x 2   . RADIOACTIVE SEED IMPLANT N/A 09/11/2018   Procedure: RADIOACTIVE SEED IMPLANT/BRACHYTHERAPY IMPLANT;  Surgeon: Kathie Rhodes, MD;  [facility];  Service: Urology;  Laterality: N/A;  . SPACE OAR INSTILLATION N/A 09/11/2018   Procedure: SPACE OAR INSTILLATION;  Surgeon: Kathie Rhodes, MD;  [facility];  Service: Urology;  Laterality: N/A;    There were no vitals filed for this visit.    Subjective Assessment - 10/29/20 0924    Subjective Patient reports that he has had some neck pain for about 8-9 years.  He reports that he has had some pain off and on since then.  He has had x-rays and MRI that shows DDD and impingement of nerve.  C4-5 was the worst.  He denies any radicula symtpoms    Limitations House hold activities    Patient Stated Goals have less , better motion    Currently in Pain? Yes    Pain Score 2     Pain Location  Neck    Pain Descriptors / Indicators Dull;Aching    Pain Type Chronic pain    Pain Radiating Towards denies    Pain Onset More than a month ago    Aggravating Factors  movements, turning head, pain up to 4/10    Pain Relieving Factors pain can be 0/10    Effect of Pain on Daily Activities just hurts and reports difficulty turning head to back up the car              [facility] PT Assessment - 10/29/20 0001      Assessment   Medical Diagnosis cervicalgia    Referring Provider (PT) J.G. Thomas    Onset Date/Surgical Date 10/01/20    Hand Dominance Right    Prior Therapy no      Precautions   Precautions None      Balance Screen   Has the patient fallen in the past 6 months No    Has the patient had a decrease in activity level because of a fear of falling?  No    Is the patient reluctant to leave their home because of a fear of falling?  No      Home Environment   Additional Comments does  housework and yardwork, has small grandkids that he lifts      Prior Function   Level of Independence Independent    Vocation Retired;Self employed    [employer] does a lot of yardwork, mostly mowing    Leisure goes to [employer] some [employer], does describe doing Scientist, research (medical)   Posture Comments fwd head, rounded shoulders      ROM / Strength   AROM / PROM / Strength AROM;Strength      AROM   Overall AROM Comments Cervical ROM decresaed 50% for all motions except side bending and right rotation was decreased 75%    AROM Assessment Site Shoulder    Right/Left Shoulder Right;Left    Right Shoulder Flexion 130 Degrees    Right Shoulder ABduction 120 Degrees    Right Shoulder Internal Rotation 25 Degrees    Right Shoulder External Rotation 70 Degrees    Left Shoulder Flexion 130 Degrees    Left Shoulder ABduction 120 Degrees    Left Shoulder Internal Rotation 25 Degrees    Left Shoulder External Rotation 70 Degrees      Strength    Overall Strength Comments Shoulder strength is WNL's      Palpation   Palpation comment some tightness in the upper traps and the cervical parapsinals, non tender, some scapular winging                      Objective measurements completed on examination: See above findings.                 PT Short Term Goals - 10/29/20 1002      PT SHORT TERM GOAL #1   Title independent with initial HEP    Time 2    Period Weeks    Status New             PT Long Term Goals - 10/29/20 1002      PT LONG TERM GOAL #1   Title understadn proper posture and body mechanics    Time 12    Period Weeks    Status New      PT LONG TERM GOAL #2   Title safe with advanced HEP in the gym    Time 12    Period Weeks    Status New      PT LONG TERM GOAL #3   Title increse cervical ROM 25%    Time 12    Period Weeks    Status New      PT LONG TERM GOAL #4   Title incresae shoulder IR to 50 degrees    Time 12    Period Weeks    Status New      PT LONG TERM GOAL #5   Title report pain decreased 25%    Time 12    Period Weeks                  Plan - 10/29/20 0998    Clinical Impression Statement PAtient referred to [place] for 15 visits from the [place].  He has neck pain, MRI showed DDD and nerve impingement at a few levels worst at C4-5.  He has very poor shoulder ROM limited in flexion, abduction and very limited in IR.  Cervical ROM was also very limited.  During the hx intake we discovered that he is doing some very bad exercises at the gym including behind the  head military and lat pulls which increases the stresses on the shoulders and the neck.  Has some rounded shoulder posture and weak scapular area.    Stability/Clinical Decision Making Stable/Uncomplicated    Clinical Decision Making Low    Rehab Potential Good    PT Frequency 2x / week    PT Duration 12 weeks   15 total visits   PT Treatment/Interventions ADLs/Self Care Home Management;Electrical  Stimulation;Moist Heat;Traction;Therapeutic activities;Therapeutic exercise;Neuromuscular re-education;Manual techniques;Patient/family education    PT Next Visit Plan work on safe gym exercises with the focus on scapular stability, may try traction    Consulted and Agree with Plan of Care Patient           Patient will benefit from skilled therapeutic intervention in order to improve the following deficits and impairments:  Decreased range of motion,Impaired UE functional use,Increased muscle spasms,Pain,Improper body mechanics,Impaired flexibility,Decreased strength,Postural dysfunction  Visit Diagnosis: Cervicalgia - Plan: PT plan of care cert/re-cert  Stiffness of left shoulder, not elsewhere classified - Plan: PT plan of care cert/re-cert  Stiffness of right shoulder, not elsewhere classified - Plan: PT plan of care cert/re-cert  Cramp and spasm - Plan: PT plan of care cert/re-cert     Problem List Patient Active Problem List   Diagnosis Date Noted  . Malignant neoplasm of prostate ([place]) 05/24/2018    Sumner Boast., PT 10/29/2020, 10:06 AM  [place]. [place], [place], [address] Phone: [phone]   Fax:  [phone]  Name: Patrick PAYES Sr. MRN: [id number] Date of Birth: [birth year]

## 2020-10-29 NOTE — Patient Instructions (Signed)
Access Code: [email or web address] URL: [email or web address] Date: 10/29/2020 Prepared by: Lum Babe  Exercises Doorway Pec Stretch at 90 Degrees Abduction - 1 x daily - 7 x weekly - 1 sets - 10 reps - 15 hold Seated Cervical Sidebending AROM - 1 x daily - 7 x weekly - 1 sets - 10 reps - 15 hold Seated Thoracic Extension with Hands Behind Neck - 1 x daily - 7 x weekly - 1 sets - 10 reps - 3 hold Standing Shoulder Internal Rotation Stretch with Dowel - 1 x daily - 7 x weekly - 1 sets - 10 reps - 15 hold

## 2020-11-04 ENCOUNTER — Ambulatory Visit: Payer: No Typology Code available for payment source | Admitting: Physical Therapy

## 2020-11-07 ENCOUNTER — Encounter: Payer: Self-pay | Admitting: Physical Therapy

## 2020-11-07 ENCOUNTER — Other Ambulatory Visit: Payer: Self-pay

## 2020-11-07 ENCOUNTER — Ambulatory Visit: Payer: No Typology Code available for payment source | Admitting: Physical Therapy

## 2020-11-07 DIAGNOSIS — M542 Cervicalgia: Secondary | ICD-10-CM

## 2020-11-07 DIAGNOSIS — M25611 Stiffness of right shoulder, not elsewhere classified: Secondary | ICD-10-CM

## 2020-11-07 DIAGNOSIS — R252 Cramp and spasm: Secondary | ICD-10-CM

## 2020-11-07 DIAGNOSIS — M25612 Stiffness of left shoulder, not elsewhere classified: Secondary | ICD-10-CM

## 2020-11-07 NOTE — Therapy (Signed)
[place]. [place], [place], [address] Phone: [phone]   Fax:  [phone]  Physical Therapy Treatment  Patient Details  Name: Patrick KINGBIRD Sr. MRN: [id number] Date of Birth: Jun 11, 1951 Referring Provider (PT): J.G. Marcello Moores   Encounter Date: 11/07/2020   PT End of Session - 11/07/20 0910    Visit Number 2    Number of Visits 15    Date for PT Re-Evaluation 01/29/21    Authorization Type VA    PT Start Time [phone]    PT Stop Time 0920    PT Time Calculation (min) 45 min    Activity Tolerance Patient tolerated treatment well    Behavior During Therapy [facility] for tasks assessed/performed           Past Medical History:  Diagnosis Date  . Cancer [facility]) July 20018   prostate  . Prostate cancer [facility])     Past Surgical History:  Procedure Laterality Date  . APPENDECTOMY  4th grade  . COLONOSCOPY  02/20/2013 and 2019  . CYSTOSCOPY  09/11/2018   Procedure: CYSTOSCOPY FLEXIBLE;  Surgeon: Kathie Rhodes, MD;  [facility];  Service: Urology;;  . PROSTATE BIOPSY     x 2   . RADIOACTIVE SEED IMPLANT N/A 09/11/2018   Procedure: RADIOACTIVE SEED IMPLANT/BRACHYTHERAPY IMPLANT;  Surgeon: Kathie Rhodes, MD;  [facility];  Service: Urology;  Laterality: N/A;  . SPACE OAR INSTILLATION N/A 09/11/2018   Procedure: SPACE OAR INSTILLATION;  Surgeon: Kathie Rhodes, MD;  [facility];  Service: Urology;  Laterality: N/A;    There were no vitals filed for this visit.   Subjective Assessment - 11/07/20 0841    Subjective Reports was haivng knee pain and he took some medicaiton for it and he is feeling better all over    Currently in Pain? Yes    Pain Score 1     Pain Location Neck    Aggravating Factors  movements                             [facility] Adult PT Treatment/Exercise - 11/07/20 0001      Exercises   Exercises Neck      Neck Exercises: Machines for Strengthening   UBE (Upper Arm  Bike) level 2 x 6 minutes    Cybex Row 55# 3x10    Cybex Chest Press 55# 3x10    Other Machines for Strengthening 10# rows and extension 2x10    Other Machines for Strengthening 20# overheaD press      Neck Exercises: Standing   Neck Retraction 10 reps;3 secs    Other Standing Exercises weighted ball overhead press, doorway stretch    Other Standing Exercises 3# W backs      Modalities   Modalities Traction      Traction   Type of Traction Cervical    Min (lbs) 12    Rest Time static    Time 12                    PT Short Term Goals - 11/07/20 0913      PT SHORT TERM GOAL #1   Title independent with initial HEP    Status Partially Met             PT Long Term Goals - 11/07/20 0914      PT LONG TERM GOAL #1  Title understadn proper posture and body mechanics    Status On-going                 Plan - 11/07/20 0911    Clinical Impression Statement Patient reports that he has taken a pain pill for his knee due to knee pain and his neck is feeling better, I educated him on posture, the need to stretch the chest and shoulders as well as stretngthen the upper back.  We went over ergonomics and how the exercies and stretches will help.  He needed cues for form especially head posistion in the exercises and needed help with schest and shoulder stretches because he was so tight    PT Next Visit Plan see how traction did    Consulted and Agree with Plan of Care Patient           Patient will benefit from skilled therapeutic intervention in order to improve the following deficits and impairments:  Decreased range of motion,Impaired UE functional use,Increased muscle spasms,Pain,Improper body mechanics,Impaired flexibility,Decreased strength,Postural dysfunction  Visit Diagnosis: Cervicalgia  Stiffness of left shoulder, not elsewhere classified  Stiffness of right shoulder, not elsewhere classified  Cramp and spasm     Problem List Patient Active  Problem List   Diagnosis Date Noted  . Malignant neoplasm of prostate ([place]) 05/24/2018    Sumner Boast., PT 11/07/2020, 9:14 AM  [place]. [place], [place], [address] Phone: [phone]   Fax:  [phone]  Name: Patrick GRAU Sr. MRN: [id number] Date of Birth: 08-25-1950

## 2020-11-10 ENCOUNTER — Ambulatory Visit: Payer: No Typology Code available for payment source | Admitting: Physical Therapy

## 2020-11-10 ENCOUNTER — Other Ambulatory Visit: Payer: Self-pay

## 2020-11-10 ENCOUNTER — Encounter: Payer: Self-pay | Admitting: Physical Therapy

## 2020-11-10 DIAGNOSIS — M542 Cervicalgia: Secondary | ICD-10-CM

## 2020-11-10 DIAGNOSIS — M25612 Stiffness of left shoulder, not elsewhere classified: Secondary | ICD-10-CM

## 2020-11-10 DIAGNOSIS — R252 Cramp and spasm: Secondary | ICD-10-CM

## 2020-11-10 DIAGNOSIS — M25611 Stiffness of right shoulder, not elsewhere classified: Secondary | ICD-10-CM

## 2020-11-10 NOTE — Therapy (Signed)
[place]. [place], [place], [address] Phone: [phone]   Fax:  [phone]  Physical Therapy Treatment  Patient Details  Name: Patrick TRACZ Sr. MRN: [id number] Date of Birth: [birth year] Referring Provider (PT): J.G. Marcello Moores   Encounter Date: 11/10/2020   PT End of Session - 11/10/20 0925    Visit Number 3    Number of Visits 15    Date for PT Re-Evaluation 01/29/21    Authorization Type VA    PT Start Time 0845    PT Stop Time 0938    PT Time Calculation (min) 53 min    Activity Tolerance Patient tolerated treatment well    Behavior During Therapy [facility] for tasks assessed/performed           Past Medical History:  Diagnosis Date  . Cancer [facility]) July 20018   prostate  . Prostate cancer [facility])     Past Surgical History:  Procedure Laterality Date  . APPENDECTOMY  4th grade  . COLONOSCOPY  02/20/2013 and 2019  . CYSTOSCOPY  09/11/2018   Procedure: CYSTOSCOPY FLEXIBLE;  Surgeon: Kathie Rhodes, MD;  [facility];  Service: Urology;;  . PROSTATE BIOPSY     x 2   . RADIOACTIVE SEED IMPLANT N/A 09/11/2018   Procedure: RADIOACTIVE SEED IMPLANT/BRACHYTHERAPY IMPLANT;  Surgeon: Kathie Rhodes, MD;  [facility];  Service: Urology;  Laterality: N/A;  . SPACE OAR INSTILLATION N/A 09/11/2018   Procedure: SPACE OAR INSTILLATION;  Surgeon: Kathie Rhodes, MD;  [facility];  Service: Urology;  Laterality: N/A;    There were no vitals filed for this visit.   Subjective Assessment - 11/10/20 0848    Subjective Doing better, the across the chest stretch has helped    Currently in Pain? No/denies                             [facility] Adult PT Treatment/Exercise - 11/10/20 0001      Neck Exercises: Machines for Strengthening   UBE (Upper Arm Bike) level 2 x 6 minutes    Cybex Row 55# 3x10    Cybex Chest Press 55# 3x10    Other Machines for Strengthening 15lb extension 2x12    Other  Machines for Strengthening 20# overheaD press      Neck Exercises: Standing   Neck Retraction 10 reps;3 secs   x2   Other Standing Exercises weighted ball overhead press, doorway stretch    Other Standing Exercises 3# W backs, pec stretch      Traction   Type of Traction Cervical    Min (lbs) 12    Rest Time static    Time 12                    PT Short Term Goals - 11/07/20 0913      PT SHORT TERM GOAL #1   Title independent with initial HEP    Status Partially Met             PT Long Term Goals - 11/07/20 0914      PT LONG TERM GOAL #1   Title understadn proper posture and body mechanics    Status On-going                 Plan - 11/10/20 0926    Clinical Impression Statement Pt reports improvement overall. Good carryover form last treatment  session.Cues needed for postural and pacing with seated rows. increase resistance tolerated with extensions. Difficulty with W back due to tightness and ROM limitations    Stability/Clinical Decision Making Stable/Uncomplicated    Rehab Potential Good    PT Frequency 2x / week    PT Duration 12 weeks    PT Treatment/Interventions ADLs/Self Care Home Management;Electrical Stimulation;Moist Heat;Traction;Therapeutic activities;Therapeutic exercise;Neuromuscular re-education;Manual techniques;Patient/family education    PT Next Visit Plan progress as tolerated           Patient will benefit from skilled therapeutic intervention in order to improve the following deficits and impairments:  Decreased range of motion,Impaired UE functional use,Increased muscle spasms,Pain,Improper body mechanics,Impaired flexibility,Decreased strength,Postural dysfunction  Visit Diagnosis: Stiffness of left shoulder, not elsewhere classified  Stiffness of right shoulder, not elsewhere classified  Cramp and spasm  Cervicalgia     Problem List Patient Active Problem List   Diagnosis Date Noted  . Malignant neoplasm of  prostate ([place]) 05/24/2018    Scot Jun, PTA 11/10/2020, 9:29 AM  [place]. [place], [place], [address] Phone: [phone]   Fax:  [phone]  Name: Patrick EPPS Sr. MRN: [id number] Date of Birth: 07-Jan-1951

## 2020-11-14 ENCOUNTER — Ambulatory Visit: Payer: No Typology Code available for payment source | Admitting: Physical Therapy

## 2020-11-17 ENCOUNTER — Encounter: Payer: Self-pay | Admitting: Physical Therapy

## 2020-11-17 ENCOUNTER — Ambulatory Visit: Payer: No Typology Code available for payment source | Attending: Neurosurgery | Admitting: Physical Therapy

## 2020-11-17 ENCOUNTER — Other Ambulatory Visit: Payer: Self-pay

## 2020-11-17 DIAGNOSIS — M25612 Stiffness of left shoulder, not elsewhere classified: Secondary | ICD-10-CM | POA: Diagnosis present

## 2020-11-17 DIAGNOSIS — M25611 Stiffness of right shoulder, not elsewhere classified: Secondary | ICD-10-CM | POA: Diagnosis not present

## 2020-11-17 DIAGNOSIS — M542 Cervicalgia: Secondary | ICD-10-CM | POA: Insufficient documentation

## 2020-11-17 DIAGNOSIS — R252 Cramp and spasm: Secondary | ICD-10-CM | POA: Diagnosis present

## 2020-11-17 NOTE — Therapy (Signed)
[place]. [place], [place], [address] Phone: [phone]   Fax:  [phone]  Physical Therapy Treatment  Patient Details  Name: Patrick DEINES Sr. MRN: [id number] Date of Birth: 1951/06/01 Referring Provider (PT): J.G. Marcello Moores   Encounter Date: 11/17/2020   PT End of Session - 11/17/20 0923    Visit Number 4    Number of Visits 15    Date for PT Re-Evaluation 01/29/21    Authorization Type VA    PT Start Time [phone]    PT Stop Time 0925    PT Time Calculation (min) 42 min    Activity Tolerance Patient tolerated treatment well    Behavior During Therapy [facility] for tasks assessed/performed           Past Medical History:  Diagnosis Date  . Cancer [facility]) July 20018   prostate  . Prostate cancer [facility])     Past Surgical History:  Procedure Laterality Date  . APPENDECTOMY  4th grade  . COLONOSCOPY  02/20/2013 and 2019  . CYSTOSCOPY  09/11/2018   Procedure: CYSTOSCOPY FLEXIBLE;  Surgeon: Kathie Rhodes, MD;  [facility];  Service: Urology;;  . PROSTATE BIOPSY     x 2   . RADIOACTIVE SEED IMPLANT N/A 09/11/2018   Procedure: RADIOACTIVE SEED IMPLANT/BRACHYTHERAPY IMPLANT;  Surgeon: Kathie Rhodes, MD;  [facility];  Service: Urology;  Laterality: N/A;  . SPACE OAR INSTILLATION N/A 09/11/2018   Procedure: SPACE OAR INSTILLATION;  Surgeon: Kathie Rhodes, MD;  [facility];  Service: Urology;  Laterality: N/A;    There were no vitals filed for this visit.   Subjective Assessment - 11/17/20 0845    Subjective "Super"    Currently in Pain? No/denies                             [facility] Adult PT Treatment/Exercise - 11/17/20 0001      Neck Exercises: Machines for Strengthening   UBE (Upper Arm Bike) level 3 x 6 minutes    Cybex Row 55# 3x10    Cybex Chest Press 25lb 3x12    Lat Pull 55lb 3x10    Other Machines for Strengthening 15lb extension 2x12    Other Machines for Strengthening  20# overheaD press      Neck Exercises: Standing   Neck Retraction 10 reps;3 secs   x2   Other Standing Exercises W backs, pec stretch                    PT Short Term Goals - 11/07/20 0913      PT SHORT TERM GOAL #1   Title independent with initial HEP    Status Partially Met             PT Long Term Goals - 11/07/20 0914      PT LONG TERM GOAL #1   Title understadn proper posture and body mechanics    Status On-going                 Plan - 11/17/20 0923    Clinical Impression Statement Pt continues to report improvement overall with decrease pain. Good strength and ROM throughout session. Cues at times to control the eccentric phase of interventions. Limited ROM and difficulty noted W backs.    Stability/Clinical Decision Making Stable/Uncomplicated    Rehab Potential Good    PT Frequency 2x / week  PT Duration 12 weeks    PT Treatment/Interventions ADLs/Self Care Home Management;Electrical Stimulation;Moist Heat;Traction;Therapeutic activities;Therapeutic exercise;Neuromuscular re-education;Manual techniques;Patient/family education    PT Next Visit Plan progress as tolerated           Patient will benefit from skilled therapeutic intervention in order to improve the following deficits and impairments:  Decreased range of motion,Impaired UE functional use,Increased muscle spasms,Pain,Improper body mechanics,Impaired flexibility,Decreased strength,Postural dysfunction  Visit Diagnosis: Stiffness of right shoulder, not elsewhere classified  Cervicalgia  Cramp and spasm  Stiffness of left shoulder, not elsewhere classified     Problem List Patient Active Problem List   Diagnosis Date Noted  . Malignant neoplasm of prostate ([place]) 05/24/2018    Scot Jun 11/17/2020, 9:26 AM  [place]. [place], [place], [address] Phone: [phone]   Fax:  [phone]  Name:  Patrick BELCHER Sr. MRN: [id number] Date of Birth: [birth year]

## 2020-11-21 ENCOUNTER — Ambulatory Visit: Payer: No Typology Code available for payment source | Admitting: Physical Therapy

## 2020-11-21 ENCOUNTER — Other Ambulatory Visit: Payer: Self-pay

## 2020-11-21 ENCOUNTER — Encounter: Payer: Self-pay | Admitting: Physical Therapy

## 2020-11-21 DIAGNOSIS — R252 Cramp and spasm: Secondary | ICD-10-CM

## 2020-11-21 DIAGNOSIS — M542 Cervicalgia: Secondary | ICD-10-CM

## 2020-11-21 DIAGNOSIS — M25612 Stiffness of left shoulder, not elsewhere classified: Secondary | ICD-10-CM

## 2020-11-21 DIAGNOSIS — M25611 Stiffness of right shoulder, not elsewhere classified: Secondary | ICD-10-CM | POA: Diagnosis not present

## 2020-11-21 NOTE — Therapy (Signed)
[place]. [place], [place], [address] Phone: [phone]   Fax:  [phone]  Physical Therapy Treatment  Patient Details  Name: Patrick BATSON Sr. MRN: [id number] Date of Birth: 28-Aug-1950 Referring Provider (PT): J.G. Marcello Moores   Encounter Date: 11/21/2020   PT End of Session - 11/21/20 0837    Visit Number 5    Number of Visits 15    Date for PT Re-Evaluation 01/29/21    Authorization Type [place]    PT Start Time 0754    PT Stop Time 0850    PT Time Calculation (min) 56 min    Activity Tolerance Patient tolerated treatment well    Behavior During Therapy [facility] for tasks assessed/performed           Past Medical History:  Diagnosis Date  . Cancer [facility]) July 20018   prostate  . Prostate cancer [facility])     Past Surgical History:  Procedure Laterality Date  . APPENDECTOMY  4th grade  . COLONOSCOPY  02/20/2013 and 2019  . CYSTOSCOPY  09/11/2018   Procedure: CYSTOSCOPY FLEXIBLE;  Surgeon: Kathie Rhodes, MD;  [facility];  Service: Urology;;  . PROSTATE BIOPSY     x 2   . RADIOACTIVE SEED IMPLANT N/A 09/11/2018   Procedure: RADIOACTIVE SEED IMPLANT/BRACHYTHERAPY IMPLANT;  Surgeon: Kathie Rhodes, MD;  [facility];  Service: Urology;  Laterality: N/A;  . SPACE OAR INSTILLATION N/A 09/11/2018   Procedure: SPACE OAR INSTILLATION;  Surgeon: Kathie Rhodes, MD;  [facility];  Service: Urology;  Laterality: N/A;    There were no vitals filed for this visit.   Subjective Assessment - 11/21/20 0800    Subjective I feel like we are doing all the right things, I am still tight in the chest    Currently in Pain? Yes    Pain Score 1     Pain Location Neck    Pain Relieving Factors traction and all the exercises                             [facility] Adult PT Treatment/Exercise - 11/21/20 0001      Neck Exercises: Machines for Strengthening   Nustep level 5 x 6 minutes    Cybex Row 55# 3x10     Cybex Chest Press 25lb 3x12    Lat Pull 55lb 3x10    Other Machines for Strengthening 15lb extension 2x12 and rows    Other Machines for Strengthening 55# triceps, 20# biceps      Neck Exercises: Standing   Other Standing Exercises weighted ball overhead press, doorway stretch    Other Standing Exercises W backs, pec stretch      Neck Exercises: Seated   Money 20 reps;3 secs    Money Limitations with green tband      Traction   Type of Traction Cervical    Min (lbs) 12    Rest Time static    Time 12                    PT Short Term Goals - 11/07/20 0913      PT SHORT TERM GOAL #1   Title independent with initial HEP    Status Partially Met             PT Long Term Goals - 11/21/20 0839      PT LONG TERM GOAL #1  Title understadn proper posture and body mechanics    Status Partially Met      PT LONG TERM GOAL #2   Title safe with advanced HEP in the gym    Status Partially Met      PT LONG TERM GOAL #3   Title increse cervical ROM 25%    Status On-going      PT LONG TERM GOAL #4   Title incresae shoulder IR to 50 degrees    Status On-going                 Plan - 11/21/20 0837    Clinical Impression Statement Patient is doing well, minimal c/o pain, needs verbal and visual cues for head and neck position during exercises, educated him on not straining the neck sith times when he is tired or the work is hard.  He does have decreased shoulder ROM and tightness in the chest, some of the exercises PT provides overpressure to get better shoulder ROM    PT Next Visit Plan continue to work on his shoulder ROM and upper back stability    Consulted and Agree with Plan of Care Patient           Patient will benefit from skilled therapeutic intervention in order to improve the following deficits and impairments:  Decreased range of motion,Impaired UE functional use,Increased muscle spasms,Pain,Improper body mechanics,Impaired flexibility,Decreased  strength,Postural dysfunction  Visit Diagnosis: Stiffness of right shoulder, not elsewhere classified  Cervicalgia  Cramp and spasm  Stiffness of left shoulder, not elsewhere classified     Problem List Patient Active Problem List   Diagnosis Date Noted  . Malignant neoplasm of prostate ([place]) 05/24/2018    Sumner Boast., PT 11/21/2020, 8:40 AM  [address]. [place], [place], [address] Phone: [phone]   Fax:  [phone]  Name: Patrick DESTIN Sr. MRN: [id number] Date of Birth: [birth year]

## 2020-11-24 ENCOUNTER — Other Ambulatory Visit: Payer: Self-pay

## 2020-11-24 ENCOUNTER — Encounter: Payer: Self-pay | Admitting: Physical Therapy

## 2020-11-24 ENCOUNTER — Ambulatory Visit: Payer: No Typology Code available for payment source | Admitting: Physical Therapy

## 2020-11-24 DIAGNOSIS — M25611 Stiffness of right shoulder, not elsewhere classified: Secondary | ICD-10-CM

## 2020-11-24 DIAGNOSIS — R252 Cramp and spasm: Secondary | ICD-10-CM

## 2020-11-24 DIAGNOSIS — M542 Cervicalgia: Secondary | ICD-10-CM

## 2020-11-24 DIAGNOSIS — M25612 Stiffness of left shoulder, not elsewhere classified: Secondary | ICD-10-CM

## 2020-11-24 NOTE — Therapy (Signed)
[address]. [place], [place], [address] Phone: [phone]   Fax:  [phone]  Physical Therapy Treatment  Patient Details  Name: Patrick LOEBER Sr. MRN: [id number] Date of Birth: 1951/05/10 Referring Provider (PT): J.G. Marcello Moores   Encounter Date: 11/24/2020   PT End of Session - 11/24/20 0916    Visit Number 6    Number of Visits 15    Date for PT Re-Evaluation 01/29/21    Authorization Type [place]    PT Start Time 0840    PT Stop Time 0935    PT Time Calculation (min) 55 min    Activity Tolerance Patient tolerated treatment well    Behavior During Therapy [facility] for tasks assessed/performed           Past Medical History:  Diagnosis Date  . Cancer [facility]) July 20018   prostate  . Prostate cancer [facility])     Past Surgical History:  Procedure Laterality Date  . APPENDECTOMY  4th grade  . COLONOSCOPY  02/20/2013 and 2019  . CYSTOSCOPY  09/11/2018   Procedure: CYSTOSCOPY FLEXIBLE;  Surgeon: Kathie Rhodes, MD;  [facility];  Service: Urology;;  . PROSTATE BIOPSY     x 2   . RADIOACTIVE SEED IMPLANT N/A 09/11/2018   Procedure: RADIOACTIVE SEED IMPLANT/BRACHYTHERAPY IMPLANT;  Surgeon: Kathie Rhodes, MD;  [facility];  Service: Urology;  Laterality: N/A;  . SPACE OAR INSTILLATION N/A 09/11/2018   Procedure: SPACE OAR INSTILLATION;  Surgeon: Kathie Rhodes, MD;  [facility];  Service: Urology;  Laterality: N/A;    There were no vitals filed for this visit.   Subjective Assessment - 11/24/20 0843    Subjective I really am doing well.  Less difficulty, I feel stronger and still need to work on my posture    Currently in Pain? Yes    Pain Score 1     Pain Location Neck    Aggravating Factors  poor posture              [facility] PT Assessment - 11/24/20 0001      AROM   Right Shoulder Flexion 150 Degrees    Right Shoulder Internal Rotation 40 Degrees    Left Shoulder Flexion 142 Degrees    Left  Shoulder Internal Rotation 40 Degrees                         [facility] Adult PT Treatment/Exercise - 11/24/20 0001      Neck Exercises: Machines for Strengthening   UBE (Upper Arm Bike) level 3 x 6 minutes    Cybex Row 55# 3x10    Cybex Chest Press 25lb 3x12    Lat Pull 55lb 3x10    Other Machines for Strengthening 55# triceps, 25# biceps      Neck Exercises: Theraband   Shoulder External Rotation 20 reps;Red      Neck Exercises: Standing   Other Standing Exercises weighted ball overhead press, doorway stretch    Other Standing Exercises W backs 3 #, 20# single arm overhead carry      Traction   Type of Traction Cervical    Min (lbs) 14    Rest Time static    Time 12                    PT Short Term Goals - 11/07/20 0913      PT SHORT TERM GOAL #1  Title independent with initial HEP    Status Partially Met             PT Long Term Goals - 11/21/20 0839      PT LONG TERM GOAL #1   Title understadn proper posture and body mechanics    Status Partially Met      PT LONG TERM GOAL #2   Title safe with advanced HEP in the gym    Status Partially Met      PT LONG TERM GOAL #3   Title increse cervical ROM 25%    Status On-going      PT LONG TERM GOAL #4   Title incresae shoulder IR to 50 degrees    Status On-going                 Plan - 11/24/20 0916    Clinical Impression Statement Patient with increased shoulder ROM measured objectively, he is still tight with flexion, abduction and IR, left shoulder had difficulty with the overhead carry.  He is improving and still needs work on posture, very tight in the pectorals    PT Next Visit Plan continue to work on his shoulder ROM and upper back stability    Consulted and Agree with Plan of Care Patient           Patient will benefit from skilled therapeutic intervention in order to improve the following deficits and impairments:  Decreased range of motion,Impaired UE functional  use,Increased muscle spasms,Pain,Improper body mechanics,Impaired flexibility,Decreased strength,Postural dysfunction  Visit Diagnosis: Stiffness of right shoulder, not elsewhere classified  Cervicalgia  Cramp and spasm  Stiffness of left shoulder, not elsewhere classified     Problem List Patient Active Problem List   Diagnosis Date Noted  . Malignant neoplasm of prostate ([place]) 05/24/2018    Sumner Boast., PT 11/24/2020, 9:18 AM  [address]. [place], [place], [address] Phone: [phone]   Fax:  [phone]  Name: Patrick HARGROVE Sr. MRN: [id number] Date of Birth: 07/07/1951

## 2020-11-27 ENCOUNTER — Other Ambulatory Visit: Payer: Self-pay

## 2020-11-27 ENCOUNTER — Encounter: Payer: Self-pay | Admitting: Physical Therapy

## 2020-11-27 ENCOUNTER — Ambulatory Visit: Payer: No Typology Code available for payment source | Admitting: Physical Therapy

## 2020-11-27 DIAGNOSIS — M25611 Stiffness of right shoulder, not elsewhere classified: Secondary | ICD-10-CM | POA: Diagnosis not present

## 2020-11-27 DIAGNOSIS — M542 Cervicalgia: Secondary | ICD-10-CM

## 2020-11-27 DIAGNOSIS — M25612 Stiffness of left shoulder, not elsewhere classified: Secondary | ICD-10-CM

## 2020-11-27 DIAGNOSIS — R252 Cramp and spasm: Secondary | ICD-10-CM

## 2020-11-27 NOTE — Therapy (Signed)
[address]. [place], [place], [address] Phone: [phone]   Fax:  [phone]  Physical Therapy Treatment  Patient Details  Name: FARON TUDISCO Sr. MRN: [id number] Date of Birth: April 30, 1951 Referring Provider (PT): J.G. Marcello Moores   Encounter Date: 11/27/2020   PT End of Session - 11/27/20 0840    Visit Number 7    Number of Visits 15    Date for PT Re-Evaluation 01/29/21    Authorization Type [place]    PT Start Time 0756    PT Stop Time 0852    PT Time Calculation (min) 56 min    Activity Tolerance Patient tolerated treatment well    Behavior During Therapy [facility] for tasks assessed/performed           Past Medical History:  Diagnosis Date  . Cancer [facility]) July 20018   prostate  . Prostate cancer [facility])     Past Surgical History:  Procedure Laterality Date  . APPENDECTOMY  4th grade  . COLONOSCOPY  02/20/2013 and 2019  . CYSTOSCOPY  09/11/2018   Procedure: CYSTOSCOPY FLEXIBLE;  Surgeon: Kathie Rhodes, MD;  [facility];  Service: Urology;;  . PROSTATE BIOPSY     x 2   . RADIOACTIVE SEED IMPLANT N/A 09/11/2018   Procedure: RADIOACTIVE SEED IMPLANT/BRACHYTHERAPY IMPLANT;  Surgeon: Kathie Rhodes, MD;  [facility];  Service: Urology;  Laterality: N/A;  . SPACE OAR INSTILLATION N/A 09/11/2018   Procedure: SPACE OAR INSTILLATION;  Surgeon: Kathie Rhodes, MD;  [facility];  Service: Urology;  Laterality: N/A;    There were no vitals filed for this visit.   Subjective Assessment - 11/27/20 0756    Subjective Doing fine, reports some trouble moving LUE across his body    Currently in Pain? No/denies                             [facility] Adult PT Treatment/Exercise - 11/27/20 0001      Neck Exercises: Machines for Strengthening   UBE (Upper Arm Bike) level 3 x 6 minutes    Cybex Row 55# 3x10    Cybex Chest Press 25lb 3x12    Lat Pull 55lb 3x10    Other Machines for Strengthening 55#  triceps, 25# biceps      Neck Exercises: Theraband   Shoulder External Rotation 20 reps;Red    Other Theraband Exercises Horiz Abd red 2x15      Neck Exercises: Standing   Neck Retraction 3 secs;20 reps    Other Standing Exercises W backs 3 #, 20# single arm overhead carry      Traction   Min (lbs) 14    Rest Time static    Time 12                    PT Short Term Goals - 11/07/20 0913      PT SHORT TERM GOAL #1   Title independent with initial HEP    Status Partially Met             PT Long Term Goals - 11/21/20 0839      PT LONG TERM GOAL #1   Title understadn proper posture and body mechanics    Status Partially Met      PT LONG TERM GOAL #2   Title safe with advanced HEP in the gym    Status Partially Met  PT LONG TERM GOAL #3   Title increse cervical ROM 25%    Status On-going      PT LONG TERM GOAL #4   Title incresae shoulder IR to 50 degrees    Status On-going                 Plan - 11/27/20 0840    Clinical Impression Statement Pt continues to do well overall. Cross body horizontal abduction stretch added between W backs. Limited ROM with W back difficulty getting elbows to touch wall. Pt does have a forward head and rounded shoulders, cervical retractions performed against wall working on posture.    Stability/Clinical Decision Making Stable/Uncomplicated    Rehab Potential Good    PT Frequency 2x / week    PT Duration 12 weeks    PT Treatment/Interventions ADLs/Self Care Home Management;Electrical Stimulation;Moist Heat;Traction;Therapeutic activities;Therapeutic exercise;Neuromuscular re-education;Manual techniques;Patient/family education    PT Next Visit Plan continue to work on his shoulder ROM and upper back stability           Patient will benefit from skilled therapeutic intervention in order to improve the following deficits and impairments:  Decreased range of motion,Impaired UE functional use,Increased muscle  spasms,Pain,Improper body mechanics,Impaired flexibility,Decreased strength,Postural dysfunction  Visit Diagnosis: Cervicalgia  Cramp and spasm  Stiffness of right shoulder, not elsewhere classified  Stiffness of left shoulder, not elsewhere classified     Problem List Patient Active Problem List   Diagnosis Date Noted  . Malignant neoplasm of prostate ([place]) 05/24/2018    Scot Jun 11/27/2020, 8:42 AM  [address] [place]. [place], [place], [address] Phone: [phone]   Fax:  [phone]  Name: ACHILLE XIANG Sr. MRN: [id number] Date of Birth: [birth year]

## 2020-12-01 ENCOUNTER — Ambulatory Visit: Payer: No Typology Code available for payment source | Admitting: Physical Therapy

## 2020-12-01 ENCOUNTER — Other Ambulatory Visit: Payer: Self-pay

## 2020-12-01 DIAGNOSIS — M542 Cervicalgia: Secondary | ICD-10-CM

## 2020-12-01 DIAGNOSIS — M25611 Stiffness of right shoulder, not elsewhere classified: Secondary | ICD-10-CM

## 2020-12-01 DIAGNOSIS — R252 Cramp and spasm: Secondary | ICD-10-CM

## 2020-12-01 DIAGNOSIS — M25612 Stiffness of left shoulder, not elsewhere classified: Secondary | ICD-10-CM

## 2020-12-01 NOTE — Therapy (Signed)
[address]. [place], [place], [address] Phone: [phone]   Fax:  [phone]  Physical Therapy Treatment  Patient Details  Name: Patrick SHIMEL Sr. MRN: [id number] Date of Birth: [birth year] Referring Provider (PT): J.G. Marcello Moores   Encounter Date: 12/01/2020   PT End of Session - 12/01/20 0843    Visit Number 8    Number of Visits 15    Date for PT Re-Evaluation 01/29/21    PT Start Time 0843    PT Stop Time 0936    PT Time Calculation (min) 53 min    Activity Tolerance Patient tolerated treatment well    Behavior During Therapy [facility] for tasks assessed/performed           Past Medical History:  Diagnosis Date  . Cancer [facility]) July 20018   prostate  . Prostate cancer [facility])     Past Surgical History:  Procedure Laterality Date  . APPENDECTOMY  4th grade  . COLONOSCOPY  02/20/2013 and 2019  . CYSTOSCOPY  09/11/2018   Procedure: CYSTOSCOPY FLEXIBLE;  Surgeon: Kathie Rhodes, MD;  [facility];  Service: Urology;;  . PROSTATE BIOPSY     x 2   . RADIOACTIVE SEED IMPLANT N/A 09/11/2018   Procedure: RADIOACTIVE SEED IMPLANT/BRACHYTHERAPY IMPLANT;  Surgeon: Kathie Rhodes, MD;  [facility];  Service: Urology;  Laterality: N/A;  . SPACE OAR INSTILLATION N/A 09/11/2018   Procedure: SPACE OAR INSTILLATION;  Surgeon: Kathie Rhodes, MD;  [facility];  Service: Urology;  Laterality: N/A;    There were no vitals filed for this visit.   Subjective Assessment - 12/01/20 0839    Subjective Doing good, "couldn't be doing any better"                             [facility] Adult PT Treatment/Exercise - 12/01/20 0001      Neck Exercises: Machines for Strengthening   UBE (Upper Arm Bike) L3 x 66mns    Cybex Row 55# 3x10    Cybex Chest Press 25# 3x12    Lat Pull 55# 3x10    Other Machines for Strengthening 55# 2x15 tricpes, 25# 2x15 biceps      Neck Exercises: Theraband   Shoulder External  Rotation 20 reps;Red      Neck Exercises: Standing   Neck Retraction 10 reps;5 secs    Other Standing Exercises Flexion, ABD stretch w/ towel on wall 2x10 ea side    Other Standing Exercises Y, T backs 3# 2x10      Traction   Type of Traction Cervical    Min (lbs) 12    Rest Time static    Time 12                    PT Short Term Goals - 11/07/20 0913      PT SHORT TERM GOAL #1   Title independent with initial HEP    Status Partially Met             PT Long Term Goals - 11/21/20 0839      PT LONG TERM GOAL #1   Title understadn proper posture and body mechanics    Status Partially Met      PT LONG TERM GOAL #2   Title safe with advanced HEP in the gym    Status Partially Met      PT LONG TERM GOAL #  3   Title increse cervical ROM 25%    Status On-going      PT LONG TERM GOAL #4   Title incresae shoulder IR to 50 degrees    Status On-going                 Plan - 12/01/20 0927    Clinical Impression Statement Pt continues to do well. Shoulder flexion/abduction and pec stretch stretch added due to pt c/o tightness. Limited ROM noted on L with shoulder flexion/abduction stretch. Noted fatigue with Y backs and cueing needed for proper posture. Cueing needed for posture, esp retacted neck during all standing TE.    PT Treatment/Interventions ADLs/Self Care Home Management;Electrical Stimulation;Moist Heat;Traction;Therapeutic activities;Therapeutic exercise;Neuromuscular re-education;Manual techniques;Patient/family education    PT Next Visit Plan Continue with shoulder ROM and upper back stability as indicated.           Patient will benefit from skilled therapeutic intervention in order to improve the following deficits and impairments:  Decreased range of motion,Impaired UE functional use,Increased muscle spasms,Pain,Improper body mechanics,Impaired flexibility,Decreased strength,Postural dysfunction  Visit Diagnosis: Cervicalgia  Cramp and  spasm  Stiffness of right shoulder, not elsewhere classified  Stiffness of left shoulder, not elsewhere classified     Problem List Patient Active Problem List   Diagnosis Date Noted  . Malignant neoplasm of prostate [facility]) 05/24/2018    Ernst Spell, [place] 12/01/2020, 9:32 AM  [address] [place]. [place], [place], [address] Phone: [phone]   Fax:  [phone]  Name: Patrick MANSOURI Sr. MRN: [id number] Date of Birth: [birth year]

## 2020-12-08 ENCOUNTER — Ambulatory Visit: Payer: No Typology Code available for payment source | Admitting: Rehabilitative and Restorative Service Providers"

## 2020-12-08 ENCOUNTER — Encounter: Payer: Self-pay | Admitting: Rehabilitative and Restorative Service Providers"

## 2020-12-08 ENCOUNTER — Other Ambulatory Visit: Payer: Self-pay

## 2020-12-08 DIAGNOSIS — M25612 Stiffness of left shoulder, not elsewhere classified: Secondary | ICD-10-CM

## 2020-12-08 DIAGNOSIS — M25611 Stiffness of right shoulder, not elsewhere classified: Secondary | ICD-10-CM | POA: Diagnosis not present

## 2020-12-08 DIAGNOSIS — M542 Cervicalgia: Secondary | ICD-10-CM

## 2020-12-08 DIAGNOSIS — R252 Cramp and spasm: Secondary | ICD-10-CM

## 2020-12-08 NOTE — Therapy (Signed)
[address]. [place], [place], [address] Phone: [phone]   Fax:  [phone]  Physical Therapy Treatment  Patient Details  Name: Patrick Rich Sr. MRN: [id number] Date of Birth: 1951-03-30 Referring Provider (PT): J.G. Marcello Moores   Encounter Date: 12/08/2020   PT End of Session - 12/08/20 0847    Visit Number 9    Number of Visits 15    Date for PT Re-Evaluation 01/29/21    PT Start Time 0839    PT Stop Time 0928    PT Time Calculation (min) 49 min    Activity Tolerance Patient tolerated treatment well    Behavior During Therapy [facility] for tasks assessed/performed           Past Medical History:  Diagnosis Date  . Cancer [facility]) July 20018   prostate  . Prostate cancer [facility])     Past Surgical History:  Procedure Laterality Date  . APPENDECTOMY  4th grade  . COLONOSCOPY  02/20/2013 and 2019  . CYSTOSCOPY  09/11/2018   Procedure: CYSTOSCOPY FLEXIBLE;  Surgeon: Kathie Rhodes, MD;  [facility];  Service: Urology;;  . PROSTATE BIOPSY     x 2   . RADIOACTIVE SEED IMPLANT N/A 09/11/2018   Procedure: RADIOACTIVE SEED IMPLANT/BRACHYTHERAPY IMPLANT;  Surgeon: Kathie Rhodes, MD;  [facility];  Service: Urology;  Laterality: N/A;  . SPACE OAR INSTILLATION N/A 09/11/2018   Procedure: SPACE OAR INSTILLATION;  Surgeon: Kathie Rhodes, MD;  [facility];  Service: Urology;  Laterality: N/A;    There were no vitals filed for this visit.   Subjective Assessment - 12/08/20 0844    Subjective I am doing okay this morning, no pain so far.    Currently in Pain? No/denies                             [facility] Adult PT Treatment/Exercise - 12/08/20 0001      Neck Exercises: Machines for Strengthening   UBE (Upper Arm Bike) L3.5 x 64mns    Cybex Row 55# 3x10    Cybex Chest Press 35# 3x12    Lat Pull 55# 3x10    Other Machines for Strengthening 55# 2x15 tricpes, 35# 2x15 biceps      Neck  Exercises: Theraband   Shoulder External Rotation 20 reps;Red      Neck Exercises: Standing   Neck Retraction 10 reps;5 secs    Other Standing Exercises IR towel stretch behind the back 10 sec hold x5 reps bilat..  Flexion, ABD stretch w/ towel on wall 2x10 ea side    Other Standing Exercises W backs, T backs 3# 2x10      Neck Exercises: Stretches   Upper Trapezius Stretch 5 reps;10 seconds;Right;Left                    PT Short Term Goals - 11/07/20 0913      PT SHORT TERM GOAL #1   Title independent with initial HEP    Status Partially Met             PT Long Term Goals - 12/08/20 1047      PT LONG TERM GOAL #1   Title understad proper posture and body mechanics    Status Partially Met      PT LONG TERM GOAL #2   Title safe with advanced HEP in the gym  PT LONG TERM GOAL #5   Title report pain decreased 25%    Status Partially Met                 Plan - 12/08/20 1044    Clinical Impression Statement Pt tolerated session well with ability to increase weight on some of the strengthening exercises.  Pt continues to exhibit improved technique and posture during ther ex.  He did not have any increase in pain during ther ex except to a 1/10 in left shoulder following IR towel stretch.  He has good motivation throughout session.  Did not perform cervical traction this visit, as pt is not currently experiencing radicular pain.    PT Treatment/Interventions ADLs/Self Care Home Management;Electrical Stimulation;Moist Heat;Traction;Therapeutic activities;Therapeutic exercise;Neuromuscular re-education;Manual techniques;Patient/family education    PT Next Visit Plan Continue with shoulder ROM and upper back stability as indicated.    Consulted and Agree with Plan of Care Patient           Patient will benefit from skilled therapeutic intervention in order to improve the following deficits and impairments:  Decreased range of motion,Impaired UE functional  use,Increased muscle spasms,Pain,Improper body mechanics,Impaired flexibility,Decreased strength,Postural dysfunction  Visit Diagnosis: Cervicalgia  Cramp and spasm  Stiffness of right shoulder, not elsewhere classified  Stiffness of left shoulder, not elsewhere classified     Problem List Patient Active Problem List   Diagnosis Date Noted  . Malignant neoplasm of prostate ([place]) 05/24/2018    Juel Burrow, PT, DPT 12/08/2020, 11:00 AM  [place]. [place], [place], [address] Phone: [phone]   Fax:  [phone]  Name: Patrick Rich Sr. MRN: [id number] Date of Birth: 09-Mar-1951

## 2020-12-12 ENCOUNTER — Ambulatory Visit: Payer: No Typology Code available for payment source | Admitting: Physical Therapy

## 2020-12-12 ENCOUNTER — Other Ambulatory Visit: Payer: Self-pay

## 2020-12-12 ENCOUNTER — Encounter: Payer: Self-pay | Admitting: Physical Therapy

## 2020-12-12 DIAGNOSIS — M542 Cervicalgia: Secondary | ICD-10-CM

## 2020-12-12 DIAGNOSIS — R252 Cramp and spasm: Secondary | ICD-10-CM

## 2020-12-12 DIAGNOSIS — M25611 Stiffness of right shoulder, not elsewhere classified: Secondary | ICD-10-CM

## 2020-12-12 DIAGNOSIS — M25612 Stiffness of left shoulder, not elsewhere classified: Secondary | ICD-10-CM

## 2020-12-12 NOTE — Therapy (Signed)
[place]. [place], [place], [address] Phone: [phone]   Fax:  [phone] Progress Note Reporting Period 10/29/20 to 12/12/20 for visit 1-10  See note below for Objective Data and Assessment of Progress/Goals.      Physical Therapy Treatment  Patient Details  Name: Patrick MCFARLANE Sr. MRN: [id number] Date of Birth: April 18, 1951 Referring Provider (PT): J.G. Marcello Moores   Encounter Date: 12/12/2020   PT End of Session - 12/12/20 0927    Visit Number 10    Number of Visits 15    Date for PT Re-Evaluation 01/29/21    PT Start Time 0845    PT Stop Time 0927    PT Time Calculation (min) 42 min           Past Medical History:  Diagnosis Date  . Cancer [facility]) July 20018   prostate  . Prostate cancer [facility])     Past Surgical History:  Procedure Laterality Date  . APPENDECTOMY  4th grade  . COLONOSCOPY  02/20/2013 and 2019  . CYSTOSCOPY  09/11/2018   Procedure: CYSTOSCOPY FLEXIBLE;  Surgeon: Kathie Rhodes, MD;  [facility];  Service: Urology;;  . PROSTATE BIOPSY     x 2   . RADIOACTIVE SEED IMPLANT N/A 09/11/2018   Procedure: RADIOACTIVE SEED IMPLANT/BRACHYTHERAPY IMPLANT;  Surgeon: Kathie Rhodes, MD;  [facility];  Service: Urology;  Laterality: N/A;  . SPACE OAR INSTILLATION N/A 09/11/2018   Procedure: SPACE OAR INSTILLATION;  Surgeon: Kathie Rhodes, MD;  [facility];  Service: Urology;  Laterality: N/A;    There were no vitals filed for this visit.   Subjective Assessment - 12/12/20 0847    Subjective Doing nothing    Currently in Pain? No/denies                             [facility] Adult PT Treatment/Exercise - 12/12/20 0001      Neck Exercises: Machines for Strengthening   UBE (Upper Arm Bike) 4L.5 x 51mns    Cybex Row 65# 3x10    Cybex Chest Press 35# 3x15    Lat Pull 65lb 3x10    Other Machines for Strengthening 55# 2x15 tricpes, 35# 2x15 biceps      Neck  Exercises: Theraband   Shoulder External Rotation 20 reps   10lb each     Neck Exercises: Standing   Neck Retraction 5 secs;20 reps    Other Standing Exercises Shoulder Ext 15lb 2x15    Other Standing Exercises Shoulder ER 10lb 2x10, Horiz Abd blue 2x10; W backs 2x10                    PT Short Term Goals - 11/07/20 0913      PT SHORT TERM GOAL #1   Title independent with initial HEP    Status Partially Met             PT Long Term Goals - 12/12/20 0928      PT LONG TERM GOAL #1   Title understad proper posture and body mechanics    Status Partially Met      PT LONG TERM GOAL #2   Title safe with advanced HEP in the gym    Status Partially Met      PT LONG TERM GOAL #3   Title increse cervical ROM 25%    Status Partially Met  PT LONG TERM GOAL #4   Title incresae shoulder IR to 50 degrees      PT LONG TERM GOAL #5   Title report pain decreased 25%    Status Achieved                 Plan - 12/12/20 0929    Clinical Impression Statement Pt is doing well and is progressing towards goals. ROM limitation remains with W back requiting over pressure form therapist. Good strength and ROM with all machine level interventions. No reports of pain. Tactile cues for UE placement with external rotation.    Stability/Clinical Decision Making Stable/Uncomplicated    Rehab Potential Good    PT Frequency 2x / week    PT Duration 12 weeks    PT Treatment/Interventions ADLs/Self Care Home Management;Electrical Stimulation;Moist Heat;Traction;Therapeutic activities;Therapeutic exercise;Neuromuscular re-education;Manual techniques;Patient/family education    PT Next Visit Plan Continue with shoulder ROM and upper back stability as indicated.           Patient will benefit from skilled therapeutic intervention in order to improve the following deficits and impairments:  Decreased range of motion,Impaired UE functional use,Increased muscle spasms,Pain,Improper  body mechanics,Impaired flexibility,Decreased strength,Postural dysfunction  Visit Diagnosis: Stiffness of right shoulder, not elsewhere classified  Stiffness of left shoulder, not elsewhere classified  Cramp and spasm  Cervicalgia     Problem List Patient Active Problem List   Diagnosis Date Noted  . Malignant neoplasm of prostate ([place]) 05/24/2018    Scot Jun, PTA 12/12/2020, 9:31 AM  [place]. [place], [place], [address] Phone: [phone]   Fax:  [phone]  Name: Patrick HAYHURST Sr. MRN: [id number] Date of Birth: 1950-10-24

## 2020-12-15 ENCOUNTER — Ambulatory Visit: Payer: No Typology Code available for payment source | Attending: Neurosurgery | Admitting: Physical Therapy

## 2020-12-15 ENCOUNTER — Encounter: Payer: Self-pay | Admitting: Physical Therapy

## 2020-12-15 ENCOUNTER — Other Ambulatory Visit: Payer: Self-pay

## 2020-12-15 DIAGNOSIS — M25612 Stiffness of left shoulder, not elsewhere classified: Secondary | ICD-10-CM

## 2020-12-15 DIAGNOSIS — M25611 Stiffness of right shoulder, not elsewhere classified: Secondary | ICD-10-CM

## 2020-12-15 DIAGNOSIS — M542 Cervicalgia: Secondary | ICD-10-CM | POA: Diagnosis not present

## 2020-12-15 DIAGNOSIS — R252 Cramp and spasm: Secondary | ICD-10-CM

## 2020-12-15 NOTE — Therapy (Signed)
[place]. [place], [place], [address] Phone: [phone]   Fax:  [phone]  Physical Therapy Treatment  Patient Details  Name: Patrick JAYNE Sr. MRN: [id number] Date of Birth: 09-Jan-1951 Referring Provider (PT): J.G. Marcello Moores   Encounter Date: 12/15/2020   PT End of Session - 12/15/20 0926    Visit Number 11    Number of Visits 15    Authorization Type [place]    PT Start Time 0845    PT Stop Time 0926    PT Time Calculation (min) 41 min    Activity Tolerance Patient tolerated treatment well    Behavior During Therapy [facility] for tasks assessed/performed           Past Medical History:  Diagnosis Date  . Cancer [facility]) July 20018   prostate  . Prostate cancer [facility])     Past Surgical History:  Procedure Laterality Date  . APPENDECTOMY  4th grade  . COLONOSCOPY  02/20/2013 and 2019  . CYSTOSCOPY  09/11/2018   Procedure: CYSTOSCOPY FLEXIBLE;  Surgeon: Kathie Rhodes, MD;  [facility];  Service: Urology;;  . PROSTATE BIOPSY     x 2   . RADIOACTIVE SEED IMPLANT N/A 09/11/2018   Procedure: RADIOACTIVE SEED IMPLANT/BRACHYTHERAPY IMPLANT;  Surgeon: Kathie Rhodes, MD;  [facility];  Service: Urology;  Laterality: N/A;  . SPACE OAR INSTILLATION N/A 09/11/2018   Procedure: SPACE OAR INSTILLATION;  Surgeon: Kathie Rhodes, MD;  [facility];  Service: Urology;  Laterality: N/A;    There were no vitals filed for this visit.   Subjective Assessment - 12/15/20 0843    Subjective Physically and mentally tired    Currently in Pain? No/denies              [facility] PT Assessment - 12/15/20 0001      AROM   Overall AROM Comments Cervical ROM decresaed 25% for all motions except L side bending was decreased 75%    Right Shoulder Flexion 156 Degrees    Right Shoulder Internal Rotation 47 Degrees    Left Shoulder Flexion 153 Degrees    Left Shoulder Internal Rotation 54 Degrees                          [facility] Adult PT Treatment/Exercise - 12/15/20 0001      Neck Exercises: Machines for Strengthening   UBE (Upper Arm Bike) L 4.5 x 23mins    Cybex Row 65# 3x10    Cybex Chest Press 35# 3x15    Lat Pull 65lb 3x10    Other Machines for Strengthening 55# 2x15 tricpes, 35# 2x15 biceps      Neck Exercises: Standing   Other Standing Exercises Shoulder Ext 15lb 2x15    Other Standing Exercises ER abd to 90 green 2x10 each, Horiz abd blue 2x10                    PT Short Term Goals - 12/15/20 0846      PT SHORT TERM GOAL #1   Title independent with initial HEP    Status Achieved             PT Long Term Goals - 12/15/20 7564      PT LONG TERM GOAL #3   Title increse cervical ROM 25%      PT LONG TERM GOAL #4   Title incresae shoulder IR to 50 degrees  Status Achieved      PT LONG TERM GOAL #5   Title report pain decreased 25%    Status Achieved                 Plan - 12/15/20 0927    Clinical Impression Statement Pt has progressed increasing his shoulder and cervical AROM, meeting some goals. Tactile cues needed to maintain appropriate UE position with Shoulder ER abducted to 90 deg. Cervical  retraction performed with ball against wall, tactile cues to upper back to prevent posterior trunk lean.    Stability/Clinical Decision Making Stable/Uncomplicated    Rehab Potential Good    PT Frequency 2x / week    PT Duration 12 weeks    PT Next Visit Plan Continue with shoulder ROM and upper back stability as indicated.           Patient will benefit from skilled therapeutic intervention in order to improve the following deficits and impairments:  Decreased range of motion,Impaired UE functional use,Increased muscle spasms,Pain,Improper body mechanics,Impaired flexibility,Decreased strength,Postural dysfunction  Visit Diagnosis: Stiffness of right shoulder, not elsewhere classified  Stiffness of left shoulder, not elsewhere  classified  Cramp and spasm  Cervicalgia     Problem List Patient Active Problem List   Diagnosis Date Noted  . Malignant neoplasm of prostate ([place]) 05/24/2018    Scot Jun, PTA 12/15/2020, 9:32 AM  [place]. [place], [place], [address] Phone: [phone]   Fax:  [phone]  Name: Patrick CASTORENA Sr. MRN: [id number] Date of Birth: [birth year]

## 2020-12-18 ENCOUNTER — Ambulatory Visit: Payer: No Typology Code available for payment source | Admitting: Physical Therapy

## 2020-12-18 ENCOUNTER — Encounter: Payer: Self-pay | Admitting: Physical Therapy

## 2020-12-18 ENCOUNTER — Other Ambulatory Visit: Payer: Self-pay

## 2020-12-18 DIAGNOSIS — M25611 Stiffness of right shoulder, not elsewhere classified: Secondary | ICD-10-CM | POA: Diagnosis not present

## 2020-12-18 DIAGNOSIS — M542 Cervicalgia: Secondary | ICD-10-CM

## 2020-12-18 DIAGNOSIS — R252 Cramp and spasm: Secondary | ICD-10-CM | POA: Diagnosis not present

## 2020-12-18 DIAGNOSIS — M25612 Stiffness of left shoulder, not elsewhere classified: Secondary | ICD-10-CM | POA: Diagnosis not present

## 2020-12-18 NOTE — Therapy (Signed)
[place]. [place], [place], [address] Phone: [phone]   Fax:  [phone]  Physical Therapy Treatment  Patient Details  Name: Patrick BATTISTE Sr. MRN: [id number] Date of Birth: 02/21/1951 Referring Provider (PT): J.G. Marcello Moores   Encounter Date: 12/18/2020   PT End of Session - 12/18/20 0918    Visit Number 12    Date for PT Re-Evaluation 01/29/21    Authorization Type [place]    PT Start Time [phone]    PT Stop Time 0920    PT Time Calculation (min) 42 min    Activity Tolerance Patient tolerated treatment well    Behavior During Therapy [facility] for tasks assessed/performed           Past Medical History:  Diagnosis Date  . Cancer [facility]) July 20018   prostate  . Prostate cancer [facility])     Past Surgical History:  Procedure Laterality Date  . APPENDECTOMY  4th grade  . COLONOSCOPY  02/20/2013 and 2019  . CYSTOSCOPY  09/11/2018   Procedure: CYSTOSCOPY FLEXIBLE;  Surgeon: Kathie Rhodes, MD;  [facility];  Service: Urology;;  . PROSTATE BIOPSY     x 2   . RADIOACTIVE SEED IMPLANT N/A 09/11/2018   Procedure: RADIOACTIVE SEED IMPLANT/BRACHYTHERAPY IMPLANT;  Surgeon: Kathie Rhodes, MD;  [facility];  Service: Urology;  Laterality: N/A;  . SPACE OAR INSTILLATION N/A 09/11/2018   Procedure: SPACE OAR INSTILLATION;  Surgeon: Kathie Rhodes, MD;  [facility];  Service: Urology;  Laterality: N/A;    There were no vitals filed for this visit.   Subjective Assessment - 12/18/20 0840    Subjective "Pretty good for a man my age"    Currently in Pain? No/denies                             [facility] Adult PT Treatment/Exercise - 12/18/20 0001      Neck Exercises: Machines for Strengthening   UBE (Upper Arm Bike) L 4.5 x 32mins    Cybex Row 65# 3x12    Cybex Chest Press 45# 3x12    Lat Pull 65lb 3x12    Other Machines for Strengthening 55# 2x15 tricpes, 35# 2x15 biceps      Neck Exercises:  Standing   Neck Retraction 5 secs;20 reps    Upper Extremity D2 Flexion;20 reps;Theraband    Theraband Level (UE D2) Level 3 (Green)    Other Standing Exercises Shoulder Ext 15lb 2x15    Other Standing Exercises Horiz abd blue 2x10                    PT Short Term Goals - 12/15/20 0846      PT SHORT TERM GOAL #1   Title independent with initial HEP    Status Achieved             PT Long Term Goals - 12/15/20 0927      PT LONG TERM GOAL #3   Title increse cervical ROM 25%      PT LONG TERM GOAL #4   Title incresae shoulder IR to 50 degrees    Status Achieved      PT LONG TERM GOAL #5   Title report pain decreased 25%    Status Achieved                 Plan - 12/18/20 0918    Clinical  Impression Statement Pt did well overall, increase resistance and or reps tolerated with all machine level interventions. Added D2 shoulder flexion, decrease ROM and end range weakness noted. Tactile cues needed to T spine to prevent posterior leaning with cervical retractions.    Stability/Clinical Decision Making Stable/Uncomplicated    Rehab Potential Good    PT Frequency 2x / week    PT Duration 12 weeks    PT Treatment/Interventions ADLs/Self Care Home Management;Electrical Stimulation;Moist Heat;Traction;Therapeutic activities;Therapeutic exercise;Neuromuscular re-education;Manual techniques;Patient/family education    PT Next Visit Plan Continue with shoulder ROM and upper back stability as indicated.           Patient will benefit from skilled therapeutic intervention in order to improve the following deficits and impairments:  Decreased range of motion,Impaired UE functional use,Increased muscle spasms,Pain,Improper body mechanics,Impaired flexibility,Decreased strength,Postural dysfunction  Visit Diagnosis: Stiffness of right shoulder, not elsewhere classified  Cramp and spasm  Cervicalgia  Stiffness of left shoulder, not elsewhere  classified     Problem List Patient Active Problem List   Diagnosis Date Noted  . Malignant neoplasm of prostate ([place]) 05/24/2018    Scot Jun, PTA 12/18/2020, 9:24 AM  [place]. [place], [place], [address] Phone: [phone]   Fax:  [phone]  Name: Patrick DIMARTINO Sr. MRN: [id number] Date of Birth: 12-23-1950

## 2020-12-19 DIAGNOSIS — M25611 Stiffness of right shoulder, not elsewhere classified: Secondary | ICD-10-CM | POA: Diagnosis present

## 2020-12-19 DIAGNOSIS — R252 Cramp and spasm: Secondary | ICD-10-CM | POA: Diagnosis present

## 2020-12-19 DIAGNOSIS — M25612 Stiffness of left shoulder, not elsewhere classified: Secondary | ICD-10-CM | POA: Diagnosis present

## 2020-12-19 DIAGNOSIS — M542 Cervicalgia: Secondary | ICD-10-CM | POA: Diagnosis present

## 2020-12-22 ENCOUNTER — Ambulatory Visit: Payer: No Typology Code available for payment source | Admitting: Physical Therapy

## 2020-12-23 ENCOUNTER — Encounter: Payer: Self-pay | Admitting: Rehabilitative and Restorative Service Providers"

## 2020-12-23 ENCOUNTER — Ambulatory Visit: Payer: No Typology Code available for payment source | Admitting: Rehabilitative and Restorative Service Providers"

## 2020-12-23 ENCOUNTER — Other Ambulatory Visit: Payer: Self-pay

## 2020-12-23 DIAGNOSIS — M542 Cervicalgia: Secondary | ICD-10-CM

## 2020-12-23 DIAGNOSIS — R252 Cramp and spasm: Secondary | ICD-10-CM | POA: Diagnosis present

## 2020-12-23 DIAGNOSIS — M25611 Stiffness of right shoulder, not elsewhere classified: Secondary | ICD-10-CM

## 2020-12-23 DIAGNOSIS — M25612 Stiffness of left shoulder, not elsewhere classified: Secondary | ICD-10-CM | POA: Diagnosis present

## 2020-12-23 NOTE — Therapy (Signed)
[place]. [place], [place], [address] Phone: [phone]   Fax:  [phone]  Physical Therapy Treatment  Patient Details  Name: Patrick DIEL Sr. MRN: [id number] Date of Birth: 09-Feb-1951 Referring Provider (PT): J.G. Marcello Moores   Encounter Date: 12/23/2020   PT End of Session - 12/23/20 0756    Visit Number 13    Number of Visits 15    Date for PT Re-Evaluation 01/29/21    Authorization Type [place]    PT Start Time 0754    PT Stop Time 0835    PT Time Calculation (min) 41 min    Activity Tolerance Patient tolerated treatment well    Behavior During Therapy [facility] for tasks assessed/performed           Past Medical History:  Diagnosis Date  . Cancer [facility]) July 20018   prostate  . Prostate cancer [facility])     Past Surgical History:  Procedure Laterality Date  . APPENDECTOMY  4th grade  . COLONOSCOPY  02/20/2013 and 2019  . CYSTOSCOPY  09/11/2018   Procedure: CYSTOSCOPY FLEXIBLE;  Surgeon: Kathie Rhodes, MD;  [facility];  Service: Urology;;  . PROSTATE BIOPSY     x 2   . RADIOACTIVE SEED IMPLANT N/A 09/11/2018   Procedure: RADIOACTIVE SEED IMPLANT/BRACHYTHERAPY IMPLANT;  Surgeon: Kathie Rhodes, MD;  [facility];  Service: Urology;  Laterality: N/A;  . SPACE OAR INSTILLATION N/A 09/11/2018   Procedure: SPACE OAR INSTILLATION;  Surgeon: Kathie Rhodes, MD;  [facility];  Service: Urology;  Laterality: N/A;    There were no vitals filed for this visit.   Subjective Assessment - 12/23/20 0836    Subjective I am doing okay, I have 4 or 5 yards to mow when I leave here.    Currently in Pain? Yes    Pain Score 1     Pain Location Neck    Pain Descriptors / Indicators Aching    Pain Type Chronic pain              [facility] PT Assessment - 12/23/20 0001      Observation/Other Assessments   Focus on Therapeutic Outcomes (FOTO)  99%                         [facility] Adult PT  Treatment/Exercise - 12/23/20 0001      Neck Exercises: Machines for Strengthening   UBE (Upper Arm Bike) L 4.5 x 58mins    Cybex Row 75# 3x10    Cybex Chest Press 45# 3x12    Lat Pull 65lb 3x12    Other Machines for Strengthening 55# 2x15 tricpes, 35# 2x15 biceps      Neck Exercises: Standing   Neck Retraction 5 secs;20 reps    Upper Extremity D2 Flexion;20 reps;Theraband    Theraband Level (UE D2) Level 3 (Green)    Other Standing Exercises Shoulder Ext 20lb 2x15.  IR towel stretch x10 second hold x5 bilat.    Other Standing Exercises Horiz abd blue 2x10      Neck Exercises: Stretches   Upper Trapezius Stretch 5 reps;10 seconds;Right;Left                    PT Short Term Goals - 12/15/20 0846      PT SHORT TERM GOAL #1   Title independent with initial HEP    Status Achieved  PT Long Term Goals - 12/15/20 1324      PT LONG TERM GOAL #3   Title increse cervical ROM 25%      PT LONG TERM GOAL #4   Title incresae shoulder IR to 50 degrees    Status Achieved      PT LONG TERM GOAL #5   Title report pain decreased 25%    Status Achieved                 Plan - 12/23/20 0837    Clinical Impression Statement Patient is progressing well with therapy and has improved his FOTO to 99%.  Patient was able to increase weight on shoulder flexion and rows with some fatigue, but no incease of pain.  Pt continues to report tightness with reaching behind his back, and IR towel stretch was reviewed with patient.  He was able to perform without complication.  He would benefit from 2 additional PT sessions and discharge at that time.    Stability/Clinical Decision Making Stable/Uncomplicated    PT Treatment/Interventions ADLs/Self Care Home Management;Electrical Stimulation;Moist Heat;Traction;Therapeutic activities;Therapeutic exercise;Neuromuscular re-education;Manual techniques;Patient/family education    PT Next Visit Plan Continue with shoulder ROM and  upper back stability as indicated.    Consulted and Agree with Plan of Care Patient           Patient will benefit from skilled therapeutic intervention in order to improve the following deficits and impairments:  Decreased range of motion,Impaired UE functional use,Increased muscle spasms,Pain,Improper body mechanics,Impaired flexibility,Decreased strength,Postural dysfunction  Visit Diagnosis: Stiffness of right shoulder, not elsewhere classified  Cramp and spasm  Cervicalgia  Stiffness of left shoulder, not elsewhere classified     Problem List Patient Active Problem List   Diagnosis Date Noted  . Malignant neoplasm of prostate ([place]) 05/24/2018    Juel Burrow, PT, DPT 12/23/2020, 8:45 AM  [place]. [place], [place], [address] Phone: [phone]   Fax:  [phone]  Name: Patrick TESTA Sr. MRN: [id number] Date of Birth: [birth year]

## 2020-12-25 ENCOUNTER — Ambulatory Visit: Payer: No Typology Code available for payment source | Admitting: Physical Therapy

## 2020-12-25 ENCOUNTER — Other Ambulatory Visit: Payer: Self-pay

## 2020-12-25 ENCOUNTER — Encounter: Payer: Self-pay | Admitting: Physical Therapy

## 2020-12-25 DIAGNOSIS — M25611 Stiffness of right shoulder, not elsewhere classified: Secondary | ICD-10-CM

## 2020-12-25 DIAGNOSIS — R252 Cramp and spasm: Secondary | ICD-10-CM | POA: Diagnosis not present

## 2020-12-25 DIAGNOSIS — M542 Cervicalgia: Secondary | ICD-10-CM

## 2020-12-25 DIAGNOSIS — M25612 Stiffness of left shoulder, not elsewhere classified: Secondary | ICD-10-CM

## 2020-12-25 NOTE — Therapy (Signed)
[place]. [place], [place], [address] Phone: [phone]   Fax:  [phone]  Physical Therapy Treatment  Patient Details  Name: Patrick BERANEK Sr. MRN: [id number] Date of Birth: [birth year] Referring Provider (PT): J.G. Marcello Moores   Encounter Date: 12/25/2020   PT End of Session - 12/25/20 0841    Visit Number 14    Date for PT Re-Evaluation 01/29/21    Authorization Type [place]    PT Start Time 0800    PT Stop Time 0844    PT Time Calculation (min) 44 min    Activity Tolerance Patient tolerated treatment well    Behavior During Therapy [facility] for tasks assessed/performed           Past Medical History:  Diagnosis Date  . Cancer [facility]) July 20018   prostate  . Prostate cancer [facility])     Past Surgical History:  Procedure Laterality Date  . APPENDECTOMY  4th grade  . COLONOSCOPY  02/20/2013 and 2019  . CYSTOSCOPY  09/11/2018   Procedure: CYSTOSCOPY FLEXIBLE;  Surgeon: Kathie Rhodes, MD;  [facility];  Service: Urology;;  . PROSTATE BIOPSY     x 2   . RADIOACTIVE SEED IMPLANT N/A 09/11/2018   Procedure: RADIOACTIVE SEED IMPLANT/BRACHYTHERAPY IMPLANT;  Surgeon: Kathie Rhodes, MD;  [facility];  Service: Urology;  Laterality: N/A;  . SPACE OAR INSTILLATION N/A 09/11/2018   Procedure: SPACE OAR INSTILLATION;  Surgeon: Kathie Rhodes, MD;  [facility];  Service: Urology;  Laterality: N/A;    There were no vitals filed for this visit.   Subjective Assessment - 12/25/20 0801    Subjective "Great"    Currently in Pain? No/denies                             [facility] Adult PT Treatment/Exercise - 12/25/20 0001      Neck Exercises: Machines for Strengthening   Nustep L4 x 6 min UE only    Cybex Row 65# 3x12    Cybex Chest Press 45# 3x12    Lat Pull 65lb 3x12    Other Machines for Strengthening 55# 2x15 tricpep      Neck Exercises: Standing   Neck Retraction 5 secs;20 reps    Upper  Extremity D2 Flexion;20 reps;Theraband    Theraband Level (UE D2) Level 3 (Green)    Other Standing Exercises Shoulder Flex & abd 5lb 2x10    Other Standing Exercises Horiz abd blue 2x10; OHP 4lb 2x10                    PT Short Term Goals - 12/15/20 0846      PT SHORT TERM GOAL #1   Title independent with initial HEP    Status Achieved             PT Long Term Goals - 12/15/20 0927      PT LONG TERM GOAL #3   Title increse cervical ROM 25%      PT LONG TERM GOAL #4   Title incresae shoulder IR to 50 degrees    Status Achieved      PT LONG TERM GOAL #5   Title report pain decreased 25%    Status Achieved                 Plan - 12/25/20 0841    Clinical Impression Statement Pt did well  overall today. External rotation weakness present with D2 flexion. OHP was difficulty due to limited UE ROM, pt with excessive lumbar ext to complete interventions. Good effort and strength throughout    Stability/Clinical Decision Making Stable/Uncomplicated    Rehab Potential Good    PT Frequency 2x / week    PT Duration 12 weeks    PT Treatment/Interventions ADLs/Self Care Home Management;Electrical Stimulation;Moist Heat;Traction;Therapeutic activities;Therapeutic exercise;Neuromuscular re-education;Manual techniques;Patient/family education    PT Next Visit Plan Continue with shoulder ROM and upper back stability as indicated.           Patient will benefit from skilled therapeutic intervention in order to improve the following deficits and impairments:  Decreased range of motion,Impaired UE functional use,Increased muscle spasms,Pain,Improper body mechanics,Impaired flexibility,Decreased strength,Postural dysfunction  Visit Diagnosis: Cervicalgia  Cramp and spasm  Stiffness of right shoulder, not elsewhere classified  Stiffness of left shoulder, not elsewhere classified     Problem List Patient Active Problem List   Diagnosis Date Noted  . Malignant  neoplasm of prostate ([place]) 05/24/2018    Scot Jun, PTA 12/25/2020, 8:44 AM  [place]. [place], [place], [address] Phone: [phone]   Fax:  [phone]  Name: IVANN TRIMARCO Sr. MRN: [id number] Date of Birth: Jan 03, 1951

## 2020-12-29 ENCOUNTER — Ambulatory Visit: Payer: No Typology Code available for payment source | Admitting: Physical Therapy

## 2020-12-29 ENCOUNTER — Encounter: Payer: Self-pay | Admitting: Physical Therapy

## 2020-12-29 ENCOUNTER — Other Ambulatory Visit: Payer: Self-pay

## 2020-12-29 DIAGNOSIS — M25611 Stiffness of right shoulder, not elsewhere classified: Secondary | ICD-10-CM

## 2020-12-29 DIAGNOSIS — M542 Cervicalgia: Secondary | ICD-10-CM | POA: Diagnosis not present

## 2020-12-29 DIAGNOSIS — R252 Cramp and spasm: Secondary | ICD-10-CM

## 2020-12-29 DIAGNOSIS — M25612 Stiffness of left shoulder, not elsewhere classified: Secondary | ICD-10-CM | POA: Diagnosis not present

## 2020-12-29 NOTE — Therapy (Signed)
[address] [place], [place], [address] Phone: [phone]   Fax:  [phone]  Physical Therapy Treatment  Patient Details  Name: Patrick KELTNER Sr. MRN: [id number] Date of Birth: Feb 03, 1951 Referring Provider (PT): J.G. Marcello Moores   Encounter Date: 12/29/2020   PT End of Session - 12/29/20 0925    Visit Number 15    Number of Visits 15    Date for PT Re-Evaluation 01/29/21    Authorization Type [facility]    PT Start Time 0800    PT Stop Time 0845    PT Time Calculation (min) 45 min    Activity Tolerance Patient tolerated treatment well    Behavior During Therapy [facility] for tasks assessed/performed           Past Medical History:  Diagnosis Date  . Cancer [facility]) July 20018   prostate  . Prostate cancer [facility])     Past Surgical History:  Procedure Laterality Date  . APPENDECTOMY  4th grade  . COLONOSCOPY  02/20/2013 and 2019  . CYSTOSCOPY  09/11/2018   Procedure: CYSTOSCOPY FLEXIBLE;  Surgeon: Kathie Rhodes, MD;  Location: [facility];  Service: Urology;;  . PROSTATE BIOPSY     x 2   . RADIOACTIVE SEED IMPLANT N/A 09/11/2018   Procedure: RADIOACTIVE SEED IMPLANT/BRACHYTHERAPY IMPLANT;  Surgeon: Kathie Rhodes, MD;  Location: [facility];  Service: Urology;  Laterality: N/A;  . SPACE OAR INSTILLATION N/A 09/11/2018   Procedure: SPACE OAR INSTILLATION;  Surgeon: Kathie Rhodes, MD;  Location: [facility];  Service: Urology;  Laterality: N/A;    There were no vitals filed for this visit.   Subjective Assessment - 12/29/20 0843    Subjective "Great"    Limitations House hold activities              [facility] PT Assessment - 12/29/20 0001      AROM   Overall AROM Comments Cervical ROM decresaed 25% for all motions except L side bending was decreased 50%                         [facility] Adult PT Treatment/Exercise - 12/29/20 0001      Neck Exercises: Machines for Strengthening   UBE (Upper Arm Bike) L 4.5 x 67mns     Cybex Row 65# 3x12    Cybex Chest Press 45# 3x12    Lat Pull 65lb 3x12    Other Machines for Strengthening 55# 2x15 tricep      Neck Exercises: Standing   Neck Retraction 5 secs;20 reps    Upper Extremity D2 Flexion;20 reps;Theraband    Theraband Level (UE D2) Level 3 (Green)    Other Standing Exercises Shoulder Flex & abd 6lb 2x10    Other Standing Exercises Horiz abd blue 2x10; OHP 4lb 2x10                    PT Short Term Goals - 12/15/20 0846      PT SHORT TERM GOAL #1   Title independent with initial HEP    Status Achieved             PT Long Term Goals - 12/29/20 0851      PT LONG TERM GOAL #3   Title increse cervical ROM 25%    Status Achieved                 Plan - 12/29/20 04801  Clinical Impression Statement Pt has progressed meeting all goals. He reports no functional limitations and is pleased with his current functional status. Good strength and ROM with all exercises. Some cervical L side bending limitation remains.    Stability/Clinical Decision Making Stable/Uncomplicated    Rehab Potential Good    PT Frequency 2x / week    PT Duration 12 weeks    PT Treatment/Interventions ADLs/Self Care Home Management;Electrical Stimulation;Moist Heat;Traction;Therapeutic activities;Therapeutic exercise;Neuromuscular re-education;Manual techniques;Patient/family education    PT Next Visit Plan D/C PT           Patient will benefit from skilled therapeutic intervention in order to improve the following deficits and impairments:  Decreased range of motion,Impaired UE functional use,Increased muscle spasms,Pain,Improper body mechanics,Impaired flexibility,Decreased strength,Postural dysfunction  Visit Diagnosis: Stiffness of right shoulder, not elsewhere classified  Cramp and spasm  Cervicalgia     Problem List Patient Active Problem List   Diagnosis Date Noted  . Malignant neoplasm of prostate ([place]) 05/24/2018   PHYSICAL THERAPY  DISCHARGE SUMMARY  Visits from Start of Care: 15  Plan: Patient agrees to discharge.  Patient goals were met. Patient is being discharged due to meeting the stated rehab goals.  ?????     Scot Jun, PTA 12/29/2020, 9:27 AM  [address] [place], [place], [address] Phone: [phone]   Fax:  [phone]  Name: Patrick HIGINBOTHAM Sr. MRN: [id number] Date of Birth: 16-Feb-1951

## 2021-01-01 ENCOUNTER — Encounter: Payer: Non-veteran care | Admitting: Physical Therapy
